# Patient Record
Sex: Female | Born: 1990 | Race: Black or African American | Hispanic: No | Smoking: Never smoker
Health system: Southern US, Community
[De-identification: ages and names within clinical notes are randomized; demographics above are authoritative.]

## PROBLEM LIST (undated history)

## (undated) DIAGNOSIS — Z8619 Personal history of other infectious and parasitic diseases: Secondary | ICD-10-CM

## (undated) DIAGNOSIS — L709 Acne, unspecified: Secondary | ICD-10-CM

## (undated) HISTORY — DX: Acne, unspecified: L70.9

## (undated) HISTORY — DX: Personal history of other infectious and parasitic diseases: Z86.19

---
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
## 2012-01-01 ENCOUNTER — Encounter: Payer: Self-pay | Admitting: Internal Medicine

## 2012-01-07 ENCOUNTER — Ambulatory Visit (INDEPENDENT_AMBULATORY_CARE_PROVIDER_SITE_OTHER): Payer: 59 | Admitting: Family Medicine

## 2012-01-07 ENCOUNTER — Encounter: Payer: Self-pay | Admitting: Family Medicine

## 2012-01-07 VITALS — BP 120/80 | HR 92 | Ht 63.0 in | Wt 143.0 lb

## 2012-01-07 DIAGNOSIS — Z8349 Family history of other endocrine, nutritional and metabolic diseases: Secondary | ICD-10-CM

## 2012-01-07 DIAGNOSIS — Z309 Encounter for contraceptive management, unspecified: Secondary | ICD-10-CM

## 2012-01-07 DIAGNOSIS — Z23 Encounter for immunization: Secondary | ICD-10-CM

## 2012-01-07 DIAGNOSIS — Z0000 Encounter for general adult medical examination without abnormal findings: Secondary | ICD-10-CM

## 2012-01-07 DIAGNOSIS — IMO0001 Reserved for inherently not codable concepts without codable children: Secondary | ICD-10-CM

## 2012-01-07 DIAGNOSIS — L708 Other acne: Secondary | ICD-10-CM

## 2012-01-07 DIAGNOSIS — Z83438 Family history of other disorder of lipoprotein metabolism and other lipidemia: Secondary | ICD-10-CM

## 2012-01-07 DIAGNOSIS — N760 Acute vaginitis: Secondary | ICD-10-CM

## 2012-01-07 DIAGNOSIS — Z13220 Encounter for screening for lipoid disorders: Secondary | ICD-10-CM

## 2012-01-07 DIAGNOSIS — L700 Acne vulgaris: Secondary | ICD-10-CM

## 2012-01-07 LAB — POCT WET PREP (WET MOUNT)

## 2012-01-07 LAB — POCT URINALYSIS DIPSTICK
Nitrite, UA: NEGATIVE
Protein, UA: NEGATIVE
Urobilinogen, UA: NEGATIVE
pH, UA: 8

## 2012-01-07 MED ORDER — NORETHINDRON-ETHINYL ESTRAD-FE 1-20/1-30/1-35 MG-MCG PO TABS: 1.0000 | ORAL_TABLET | Freq: Every day | ORAL | Status: DC

## 2012-01-07 MED ORDER — METRONIDAZOLE 500 MG PO TABS: ORAL_TABLET | ORAL | Status: DC

## 2012-01-07 NOTE — Progress Notes (Signed)
Hannah Brown is a 21 y.o. female who presents for a complete physical.  She has the following concerns:  Acne--much better overall, since being on OCP's.  Still gets occasional cystc lesion, about once a month, usually just prior to getting period.  Needs refill of OCP's.  In a sexual relationship.  Uses condoms, but did have unprotected intercourse intially. +vaginal discharge, slightly yellow, very mild odor.  Slightly itchy, feels different than yeast infections in past. Change in color x 3 months, gradually worse.  Discharge previously was clear.  Health Maintenance: Immunization History  Administered Date(s) Administered  . DTaP 07/31/1993, 11/06/1993, 01/24/1994, 11/06/1994, 02/23/1998  . Hepatitis A [birth year], 1992, 10/10/2010  . Hepatitis B 07/31/1993, 05/02/1994  . IPV 07/31/1993, 11/06/1993, 05/02/1994, 02/23/1998  . MMR 11/06/1994, 02/23/1998  . Meningococcal Conjugate 10/10/2010  . Td 09/20/2010  . Tdap 10/11/2011   Last Pap smear: never Last mammogram: never Last colonoscopy: never Last DEXA: never Dentist: twice yearly Ophtho: every 2 years, wears glasses Exercise: 3x/week (walks, elliptical)  Past Medical History  Diagnosis Date  . Acne   . H/O varicella     History reviewed. No pertinent past surgical history.  History   Social History  . Marital Status: Single    Spouse Name: N/A    Number of Children: N/A  . Years of Education: N/A   Occupational History  . Not on file.   Social History Main Topics  . Smoking status: Never Smoker   . Smokeless tobacco: Never Used  . Alcohol Use: No  . Drug Use: No  . Sexually Active: Yes -- Female partner(s)    Birth Control/ Protection: Pill, Condom   Other Topics Concern  . 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 Hyperlipidemia Father   . Diabetes Maternal Grandmother    . Diabetes Paternal Grandmother   . Cancer Neg Hx    Current Outpatient Prescriptions on File Prior to Visit  Medication Sig Dispense Refill  . diphenhydrAMINE (SOMINEX) 25 MG tablet Take 25 mg by mouth at bedtime as needed.      Marland Kitchen DISCONTD: norethindrone-ethinyl estradiol-iron (ESTROSTEP FE,TILIA FE,TRI-LEGEST FE) 1-20/1-30/1-35 MG-MCG tablet Take 1 tablet by mouth daily.  1 Package  1    Allergies  Allergen Reactions  . Minocycline    ROS:  The patient denies anorexia, fever, headaches,  vision changes, decreased hearing, ear pain, sore throat, breast concerns, chest pain, palpitations, dizziness, syncope, dyspnea on exertion, cough, swelling, nausea, vomiting, diarrhea, constipation, abdominal pain, melena, hematochezia, indigestion/heartburn, hematuria, incontinence, dysuria, irregular menstrual cycles, genital lesions, joint pains, numbness, tingling, weakness, tremor, suspicious skin lesions, depression, anxiety, abnormal bleeding/bruising, or enlarged lymph nodes. +weight gain 2nd semester (when didn't exercise as much b/c didn't continue with [employer]); lost most of it with dietary changes and increased exercise.  PHYSICAL EXAM: BP 120/80  Pulse 92  Ht 5\' 3"  (1.6 m)  Wt 143 lb (64.864 kg)  BMI 25.33 kg/m2  General Appearance:    Alert, cooperative, no distress, appears stated age  Head:    Normocephalic, without obvious abnormality, atraumatic  Eyes:    PERRL, conjunctiva/corneas clear, EOM's intact, fundi    benign  Ears:    Normal TM's and external ear canals  Nose:   Nares normal, mucosa normal, no drainage or sinus  tenderness  Throat:   Lips, mucosa, and tongue normal; teeth and gums normal  Neck:   Supple, no lymphadenopathy;  thyroid:  no   enlargement/tenderness/nodules; no carotid   bruit or JVD  Back:    Spine nontender, no curvature, ROM normal, no CVA     tenderness  Lungs:     Clear to auscultation bilaterally without wheezes, rales or     ronchi;  respirations unlabored  Chest Wall:    No tenderness or deformity   Heart:    Regular rate and rhythm, S1 and S2 normal, no murmur, rub   or gallop  Breast Exam:    No tenderness, masses, or nipple discharge or inversion.      No axillary lymphadenopathy  Abdomen:     Soft, non-tender, nondistended, normoactive bowel sounds,    no masses, no hepatosplenomegaly  Genitalia:    Normal external genitalia without lesions.  BUS and vagina normal; cervix without lesions, but noted to have ectropion anteriorly.  No cervical motion tenderness. Moderate amount of whitish yellowl vaginal discharge.  Uterus and adnexa not enlarged, nontender, no masses.  Pap not performed due to age.  Rectal:    Not performed due to age<40 and no related complaints  Extremities:   No clubbing, cyanosis or edema  Pulses:   2+ and symmetric all extremities  Skin:   Skin color, texture, turgor normal, no rashes or lesions  Lymph nodes:   Cervical, supraclavicular, and axillary nodes normal  Neurologic:   CNII-XII intact, normal strength, sensation and gait; reflexes 2+ and symmetric throughout          Psych:   Normal mood, affect, hygiene and grooming.     ASSESSMENT/PLAN:  1. Routine general medical examination at a health care facility  Visual acuity screening, POCT Urinalysis Dipstick  2. Need for hepatitis A vaccination  Hepatitis A vaccine adult IM  3. Need for HPV vaccination  HPV vaccine quadravalent 3 dose IM  4. Screening for lipoid disorders  Lipid panel  5. Family history of hyperlipidemia  Lipid panel  6. Vaginitis and vulvovaginitis, unspecified  POCT Wet Prep ([place]), GC/Chlamydia Amp Probe, Genital, metroNIDAZOLE (FLAGYL) 500 MG tablet   bacterial vaginosis  7. Contraception  norethindrone-ethinyl estradiol-iron (ESTROSTEP FE,TILIA FE,TRI-LEGEST FE) 1-20/1-30/1-35 MG-MCG tablet  8. Acne vulgaris  norethindrone-ethinyl estradiol-iron (ESTROSTEP FE,TILIA FE,TRI-LEGEST FE) 1-20/1-30/1-35 MG-MCG tablet    BV--risks and side effects of flagyl reviewed.    Discussed monthly self breast exams and yearly mammograms after the age of 59; at least 30 minutes of aerobic activity at least 5 days/week; proper sunscreen use reviewed; healthy diet, including goals of calcium and vitamin D intake and alcohol recommendations (less than or equal to 1 drink/day) reviewed; regular seatbelt use; changing batteries in smoke detectors.  Immunization recommendations discussed--Hep A#2 and Gardisil #1 given today. Encouraged regular condom use for prevention of STDs, reminded of need for backup contraception with antibiotic use on OCP's  NV for Gardisil #2 in 2 months, #3 in 6 months vs getting at school. Fasting lipids when she comes for Gardisil in 2 months.

## 2012-01-07 NOTE — Patient Instructions (Addendum)
HEALTH MAINTENANCE RECOMMENDATIONS:  It is recommended that you get at least 30 minutes of aerobic exercise at least 5 days/week (for weight loss, you may need as much as 60-90 minutes). This can be any activity that gets your heart rate up. This can be divided in 10-15 minute intervals if needed, but try and build up your endurance at least once a week.  Weight bearing exercise is also recommended twice weekly.  Eat a healthy diet with lots of vegetables, fruits and fiber.  "Colorful" foods have a lot of vitamins (ie green vegetables, tomatoes, red peppers, etc).  Limit sweet tea, regular sodas and alcoholic beverages, all of which has a lot of calories and sugar.  Up to 1 alcoholic drink daily may be beneficial for women (unless trying to lose weight, watch sugars).  Drink a lot of water.  Calcium recommendations are 1200-1500 mg daily (1500 mg for postmenopausal women or women without ovaries), and vitamin D 1000 IU daily.  This should be obtained from diet and/or supplements (vitamins), and calcium should not be taken all at once, but in divided doses.  Monthly self breast exams and yearly mammograms for women over the age of 58 is recommended.  Sunscreen of at least SPF 30 should be used on all sun-exposed parts of the skin when outside between the hours of 10 am and 4 pm (not just when at beach or pool, but even with exercise, golf, tennis, and yard work!)  Use a sunscreen that says "broad spectrum" so it covers both UVA and UVB rays, and make sure to reapply every 1-2 hours.  Remember to change the batteries in your smoke detectors when changing your clock times in the spring and fall.  Use your seat belt every time you are in a car, and please drive safely and not be distracted with cell phones and texting while driving.  Remember to use condoms to prevent STD's.  Remember to also use condoms if you are ever put on antibiotics, as these lessen the efficacy of birth control pills.  Bacterial  Vaginosis Bacterial vaginosis (BV) is a vaginal infection where the normal balance of bacteria in the vagina is disrupted. The normal balance is then replaced by an overgrowth of certain bacteria. There are several different kinds of bacteria that can cause BV. BV is the most common vaginal infection in women of childbearing age. CAUSES   The cause of BV is not fully understood. BV develops when there is an increase or imbalance of harmful bacteria.   Some activities or behaviors can upset the normal balance of bacteria in the vagina and put women at increased risk including:   Having a new sex partner or multiple sex partners.   Douching.   Using an intrauterine device (IUD) for contraception.   It is not clear what role sexual activity plays in the development of BV. However, women that have never had sexual intercourse are rarely infected with BV.  Women do not get BV from toilet seats, bedding, swimming pools or from touching objects around them.  SYMPTOMS   Grey vaginal discharge.   A fish-like odor with discharge, especially after sexual intercourse.   Itching or burning of the vagina and vulva.   Burning or pain with urination.   Some women have no signs or symptoms at all.  DIAGNOSIS  Your caregiver must examine the vagina for signs of BV. Your caregiver will perform lab tests and look at the sample of vaginal fluid through a  microscope. They will look for bacteria and abnormal cells (clue cells), a pH test higher than 4.5, and a positive amine test all associated with BV.  RISKS AND COMPLICATIONS   Pelvic inflammatory disease (PID).   Infections following gynecology surgery.   Developing HIV.   Developing herpes virus.  TREATMENT  Sometimes BV will clear up without treatment. However, all women with symptoms of BV should be treated to avoid complications, especially if gynecology surgery is planned. Female partners generally do not need to be treated. However, BV may  spread between female sex partners so treatment is helpful in preventing a recurrence of BV.   BV may be treated with antibiotics. The antibiotics come in either pill or vaginal cream forms. Either can be used with nonpregnant or pregnant women, but the recommended dosages differ. These antibiotics are not harmful to the baby.   BV can recur after treatment. If this happens, a second round of antibiotics will often be prescribed.   Treatment is important for pregnant women. If not treated, BV can cause a premature delivery, especially for a pregnant woman who had a premature birth in the past. All pregnant women who have symptoms of BV should be checked and treated.   For chronic reoccurrence of BV, treatment with a type of prescribed gel vaginally twice a week is helpful.  HOME CARE INSTRUCTIONS   Finish all medication as directed by your caregiver.   Do not have sex until treatment is completed.   Tell your sexual partner that you have a vaginal infection. They should see their caregiver and be treated if they have problems, such as a mild rash or itching.   Practice safe sex. Use condoms. Only have 1 sex partner.  PREVENTION  Basic prevention steps can help reduce the risk of upsetting the natural balance of bacteria in the vagina and developing BV:  Do not have sexual intercourse (be abstinent).   Do not douche.   Use all of the medicine prescribed for treatment of BV, even if the signs and symptoms go away.   Tell your sex partner if you have BV. That way, they can be treated, if needed, to prevent reoccurrence.  SEEK MEDICAL CARE IF:   Your symptoms are not improving after 3 days of treatment.   You have increased discharge, pain, or fever.  MAKE SURE YOU:   Understand these instructions.   Will watch your condition.   Will get help right away if you are not doing well or get worse.  FOR MORE INFORMATION  Division of STD Prevention (DSTDP), Centers for Disease Control  and Prevention: SolutionApps.[employer].za American Social Health Association (ASHA): www.ashastd.org  Document Released: 08/04/2005 Document Revised: 07/24/2011 Document Reviewed: 01/25/2009 [facility] Patient Information 2012 [place], [place].

## 2012-02-23 ENCOUNTER — Telehealth: Payer: Self-pay | Admitting: Family Medicine

## 2012-02-23 NOTE — Telephone Encounter (Signed)
Looks like at time of CPE, we did wet prep which showed BV.  Rx's metronidazole.  If ongoing symptoms of vaginal discharge, needs OV for eval

## 2012-02-23 NOTE — Telephone Encounter (Signed)
Spoke with patient and she scheduled appt with Dr.Knapp for 03/01/12.

## 2012-03-01 ENCOUNTER — Ambulatory Visit (INDEPENDENT_AMBULATORY_CARE_PROVIDER_SITE_OTHER): Payer: 59 | Admitting: Family Medicine

## 2012-03-01 ENCOUNTER — Encounter: Payer: Self-pay | Admitting: Family Medicine

## 2012-03-01 VITALS — BP 128/78 | HR 80 | Ht 63.0 in | Wt 139.0 lb

## 2012-03-01 DIAGNOSIS — Z83438 Family history of other disorder of lipoprotein metabolism and other lipidemia: Secondary | ICD-10-CM

## 2012-03-01 DIAGNOSIS — Z13220 Encounter for screening for lipoid disorders: Secondary | ICD-10-CM

## 2012-03-01 DIAGNOSIS — N760 Acute vaginitis: Secondary | ICD-10-CM

## 2012-03-01 DIAGNOSIS — Z8349 Family history of other endocrine, nutritional and metabolic diseases: Secondary | ICD-10-CM

## 2012-03-01 DIAGNOSIS — Z23 Encounter for immunization: Secondary | ICD-10-CM

## 2012-03-01 LAB — POCT WET PREP (WET MOUNT)

## 2012-03-01 LAB — LIPID PANEL
Total CHOL/HDL Ratio: 3.7 Ratio
VLDL: 22 mg/dL (ref 0–40)

## 2012-03-01 MED ORDER — METRONIDAZOLE 0.75 % VA GEL: 1.0000 | Freq: Every day | VAGINAL | Status: AC

## 2012-03-01 MED ORDER — FLUCONAZOLE 150 MG PO TABS: 150.0000 mg | ORAL_TABLET | Freq: Once | ORAL | Status: AC

## 2012-03-01 NOTE — Progress Notes (Signed)
Chief Complaint  Patient presents with  . Vaginal Discharge    took meds for BV late May-went away for one day and then came back. Symptoms include discharge that is yellow and burns. No odor noted.   HPI: She was diagnosed with BV at her CPE in May.  She took the week of metronidazole, and the discharge improved during the course of the medicine, and for one day after, but then discharge recurred.  Initially had burning discomfort externally, especially after voiding, but that only lasted a day or so. Currently, her vaginal discharge is light yellow, no odor.  Sometimes has some mild itching.  Symptoms essentially unchanged since symptoms recurrred 1 day after completing metronidazole.  She hasn't had intercourse since her last visit (CPE, where BV was diagnosed), although is still in a monogamous relationship with same partner.  Past Medical History  Diagnosis Date  . Acne   . H/O varicella    History   Social History  . Marital Status: Single    Spouse Name: N/A    Number of Children: N/A  . Years of Education: N/A   Occupational History  . Not on file.   Social History Main Topics  . Smoking status: Never Smoker   . Smokeless tobacco: Never Used  . Alcohol Use: No  . Drug Use: No  . Sexually Active: Yes -- Female partner(s)    Birth Control/ Protection: Pill, Condom   Other Topics Concern  . 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 norethindrone-ethinyl estradiol-iron (ESTROSTEP FE,TILIA FE,TRI-LEGEST FE) 1-20/1-30/1-35 MG-MCG tablet Take 1 tablet by mouth daily.  1 Package  11  . metroNIDAZOLE (FLAGYL) 500 MG tablet 1 tablet by mouth twice daily for 7 days  14 tablet  0   ROS:  Denies pelvic pain, fever, dysuria, back pain or other concerns.  Denies fevers.  PHYSICAL EXAM: BP 128/78   Pulse 80  Ht 5\' 3"  (1.6 m)  Wt 139 lb (63.05 kg)  BMI 24.62 kg/m2  LMP 02/03/2012 Pleasant female in no distress External genitalia normal without lesions. Thick white, slightly yellow-tinged discharge.  Cervix appears normal.  Normal external genitalia without lesions  KOH--some yeast seen Wet prep--+bacteria, a few clue cells  ASSESSMENT/PLAN: 1. Screening for lipoid disorders  Lipid panel  2. Family history of hyperlipidemia  Lipid panel  3. Vaginitis and vulvovaginitis, unspecified  POCT Wet Prep [facility]), fluconazole (DIFLUCAN) 150 MG tablet, metroNIDAZOLE (METROGEL VAGINAL) 0.75 % vaginal gel  4. Need for HPV vaccination  HPV vaccine quadravalent 3 dose IM   Vaginitis--treat with fluconazole and metrogel to treat for both BV and yeast, as seen on wet prep/KOH  Check lipids, as previously ordered for patient--nonfasting (4 hours since she last ate)--if abnormal, then will have her return fasting for repeat.  HPV #2 given; f/u in 6 months for 3rd/final vaccine.

## 2012-03-01 NOTE — Patient Instructions (Signed)
Take the fluconazole today to treat yeast infection.  Take the second pill next week only if you still having any discharge or itching.  (or save the second pill for yeast infection in future).  Use the metrogel intravaginally--1 applicatorful at bedtime for 5 days.  Return in 6 months for last Gardisil vaccination.

## 2012-03-02 ENCOUNTER — Encounter: Payer: Self-pay | Admitting: Family Medicine

## 2012-03-08 ENCOUNTER — Other Ambulatory Visit: Payer: 59

## 2012-03-30 ENCOUNTER — Telehealth: Payer: Self-pay | Admitting: Family Medicine

## 2012-03-30 MED ORDER — FLUCONAZOLE 150 MG PO TABS: ORAL_TABLET | ORAL | Status: DC

## 2012-03-30 NOTE — Telephone Encounter (Signed)
Finished Rx for yeast infection about 1 month ago, most of symptoms went away nut now it is back   Wants refill on  Please call patient   [employer]

## 2012-03-30 NOTE — Telephone Encounter (Signed)
Diflucan was called in

## 2012-04-05 ENCOUNTER — Telehealth: Payer: Self-pay | Admitting: Internal Medicine

## 2012-04-05 NOTE — Telephone Encounter (Signed)
Advise pt--full year of rx sent to local [facility].  She just needs to transfer rx to her [facility]

## 2012-12-10 ENCOUNTER — Other Ambulatory Visit: Payer: Self-pay | Admitting: Family Medicine

## 2012-12-10 NOTE — Telephone Encounter (Signed)
Her last physical was 12/2011 and she doesn't have one scheduled.  Refill one pack and schedule

## 2012-12-10 NOTE — Telephone Encounter (Signed)
Is this ok?

## 2013-01-12 ENCOUNTER — Telehealth: Payer: Self-pay | Admitting: Family Medicine

## 2013-01-12 MED ORDER — NORETHINDRON-ETHINYL ESTRAD-FE 1-20/1-30/1-35 MG-MCG PO TABS: 1.0000 | ORAL_TABLET | Freq: Every day | ORAL | Status: DC

## 2013-01-12 NOTE — Telephone Encounter (Signed)
Pt called for refill of birth control to [facility] on [address].  She has cpe with Dr. Lynelle Doctor on 6/2 at 9:15.

## 2013-01-12 NOTE — Telephone Encounter (Signed)
done

## 2013-01-12 NOTE — Telephone Encounter (Signed)
Will she need the refill prior to her visit? If so, call in just 1 pack

## 2013-01-17 ENCOUNTER — Encounter: Payer: Self-pay | Admitting: Family Medicine

## 2013-01-17 ENCOUNTER — Ambulatory Visit (INDEPENDENT_AMBULATORY_CARE_PROVIDER_SITE_OTHER): Payer: 59 | Admitting: Family Medicine

## 2013-01-17 VITALS — BP 140/100 | HR 100 | Ht 63.0 in | Wt 150.0 lb

## 2013-01-17 DIAGNOSIS — Z309 Encounter for contraceptive management, unspecified: Secondary | ICD-10-CM

## 2013-01-17 DIAGNOSIS — Z0000 Encounter for general adult medical examination without abnormal findings: Secondary | ICD-10-CM

## 2013-01-17 DIAGNOSIS — Z23 Encounter for immunization: Secondary | ICD-10-CM

## 2013-01-17 DIAGNOSIS — L700 Acne vulgaris: Secondary | ICD-10-CM

## 2013-01-17 DIAGNOSIS — L708 Other acne: Secondary | ICD-10-CM

## 2013-01-17 LAB — POCT URINALYSIS DIPSTICK
Glucose, UA: NEGATIVE
Leukocytes, UA: NEGATIVE
Nitrite, UA: NEGATIVE
Urobilinogen, UA: NEGATIVE

## 2013-01-17 MED ORDER — CLINDAMYCIN PHOSPHATE 1 % EX FOAM: CUTANEOUS | Status: DC

## 2013-01-17 MED ORDER — NORETHINDRON-ETHINYL ESTRAD-FE 1-20/1-30/1-35 MG-MCG PO TABS: 1.0000 | ORAL_TABLET | Freq: Every day | ORAL | Status: DC

## 2013-01-17 NOTE — Progress Notes (Signed)
Chief Complaint  Patient presents with  . Annual Exam    nonfasting annual exam, unsure whether or not she needs pap today. She would like to discuss her acne and desires a facial wash and/or cream if possible. States her acne is very cystic. UA showed trace leuks, patient is asymptomatic.   Hannah Brown is a 22 y.o. female who presents for a complete physical.  She has the following concerns: Acne, as per chief complaint.  Acne--improved with OCP's, but "not good enough".  Initially breakouts were just related with her cycles, but now is more often.  Some are cystic, but also noticing smaller pimples/pustules.  Using a benzoyl peroxide face wash twice daily, and a Clearisil acne gel for spot treatment.  Used her aunt's clindamycin 1% gel which helped clear up one of her cysts.  In a sexual relationship, using condoms.  Same partner as last year.  Immunization History  Administered Date(s) Administered  . DTaP 07/31/1993, 11/06/1993, 01/24/1994, 11/06/1994, 02/23/1998  . HPV Quadrivalent 01/07/2012, 03/01/2012, 01/17/2013  . Hepatitis A 10/10/2010, 01/07/2012  . Hepatitis B 1991-08-09, 07/31/1993, 05/02/1994  . IPV 07/31/1993, 11/06/1993, 05/02/1994, 02/23/1998  . MMR 11/06/1994, 02/23/1998  . Meningococcal Conjugate 10/10/2010  . Td 09/20/2010  . Tdap 10/11/2011   Last Pap smear: never; had pelvic exam in December (evaluation for vaginal odor, no infection found).  Doesn't think chlamydia screen was done. Last mammogram: never  Last colonoscopy: never  Last DEXA: never  Dentist: twice yearly  Ophtho: every 2 years, wears glasses; past due Exercise: daily--elliptical or walking (45 min on elliptical, 2 hrs of walking)   Lab Results  Component Value Date   CHOL 195* 03/01/2012   HDL 53 03/01/2012   LDLCALC 120* 03/01/2012   TRIG 111 03/01/2012   CHOLHDL 3.7 03/01/2012    Past Medical History  Diagnosis Date  . Acne   . H/O varicella     History reviewed. No pertinent past  surgical history.  History   Social History  . Marital Status: Single    Spouse Name: N/A    Number of Children: N/A  . Years of Education: N/A   Occupational History  . student    Social History Main Topics  . Smoking status: Never Smoker   . Smokeless tobacco: Never Used  . Alcohol Use: No  . Drug Use: No  . Sexually Active: Yes -- Female partner(s)    Birth Control/ Protection: Pill, Condom   Other Topics Concern  . 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 Hyperlipidemia Father   . Diabetes Maternal Grandmother   . Diabetes Paternal Grandmother   . Cancer Paternal Aunt     thyroid cancer    Current outpatient prescriptions:Biotin 5000 MCG CAPS, Take 1 capsule by mouth daily., Disp: , Rfl: ;  norethindrone-ethinyl estradiol-iron (TRI-LEGEST FE) 1-20/1-30/1-35 MG-MCG tablet, Take 1 tablet by mouth daily., Disp: 84 tablet, Rfl: 3;  Probiotic Product (PROBIOTIC DAILY) CAPS, Take 1 capsule by mouth daily., Disp: , Rfl: ;  vitamin C (ASCORBIC ACID) 500 MG tablet, Take 500 mg by mouth daily., Disp: , Rfl:  Clindamycin Phosphate foam, Apply to face once daily, Disp: 100 g, Rfl: 5  Allergies  Allergen Reactions  . Minocycline Hives and Other (See Comments)    Itchy throat   ROS:  The patient denies anorexia, fever, headaches, vision changes, decreased hearing, ear pain, sore throat, breast concerns, chest pain, palpitations, dizziness, syncope, dyspnea on exertion, cough, swelling, nausea, vomiting, diarrhea, constipation, abdominal pain, melena, hematochezia, indigestion/heartburn, hematuria, incontinence, dysuria, irregular menstrual cycles, genital lesions, joint pains, numbness, tingling, weakness, tremor, suspicious skin lesions, depression, anxiety, abnormal bleeding/bruising, or enlarged lymph nodes. +acne She noticed an "oniony" smell  to her vaginal area. She went to student health, and was told there was no infection.  She used probiotics and that makes the odor go away, but recurs when she stops the probiotics.  Denies any vaginal discharge. Weight gain from last year noted--she denies any change in how clothing fits; exercising more than last year, increased muscle mass. +allergies flaring, controlled with OTC loratadine prn.  Sporadic insomnia.  PHYSICAL EXAM: BP 140/100  Pulse 100  Ht 5\' 3"  (1.6 m)  Wt 150 lb (68.04 kg)  BMI 26.58 kg/m2  General Appearance:  Alert, cooperative, no distress, appears stated age   Head:  Normocephalic, without obvious abnormality, atraumatic   Eyes:  PERRL, conjunctiva/corneas clear, EOM's intact, fundi  benign   Ears:  Normal TM's and external ear canals   Nose:  Nares normal, mucosa normal, no drainage or sinus tenderness   Throat:  Lips, mucosa, and tongue normal; teeth and gums normal   Neck:  Supple, no lymphadenopathy; thyroid: no enlargement/tenderness/nodules; no carotid  bruit or JVD   Back:  Spine nontender, no curvature, ROM normal, no CVA tenderness   Lungs:  Clear to auscultation bilaterally without wheezes, rales or ronchi; respirations unlabored   Chest Wall:  No tenderness or deformity   Heart:  Regular rate and rhythm, S1 and S2 normal, no murmur, rub  or gallop   Breast Exam:  No tenderness, masses, or nipple discharge or inversion. R nipple is pierced. No axillary lymphadenopathy   Abdomen:  Soft, non-tender, nondistended, normoactive bowel sounds,  no masses, no hepatosplenomegaly   Genitalia:  Deferred.  No complaints (of discharge, pain, abnormal bleeding), and had normal pelvic exam at school  Rectal:  Not performed due to age<40 and no related complaints   Extremities:  No clubbing, cyanosis or edema   Pulses:  2+ and symmetric all extremities   Skin:  Skin color, texture, turgor normal, no rashes or lesions. Scattered pustules on forehead, chin.  Small  cystic lesion at top of forehead  Lymph nodes:  Cervical, supraclavicular, and axillary nodes normal   Neurologic:  CNII-XII intact, normal strength, sensation and gait; reflexes 2+ and symmetric throughout   Psych: Normal mood, affect, hygiene and grooming.   ASSESSMENT/PLAN:  Routine general medical examination at a health care facility - Plan: Visual acuity screening, POCT Urinalysis Dipstick, GC/Chlamydia Probe Amp  Acne vulgaris - Plan: Clindamycin Phosphate foam  Unspecified contraceptive management - Plan: norethindrone-ethinyl estradiol-iron (TRI-LEGEST FE) 1-20/1-30/1-35 MG-MCG tablet  Need for HPV vaccination - Plan: HPV vaccine quadravalent 3 dose IM   Urine screen for chlamydia (pelvic done in December at school); declines HIV screen.  Discussed monthly self breast exams and yearly mammograms after the age of 9; at least 30 minutes of aerobic activity at least 5 days/week; proper sunscreen use reviewed; healthy diet, including goals of calcium and vitamin D intake and alcohol recommendations (less than or equal to 1 drink/day) reviewed; regular seatbelt use; changing batteries in smoke detectors. Immunization recommendations discussed. Encouraged regular condom use for prevention of STDs, reminded of need for backup contraception with antibiotic use on OCP's  3rd HPV today.  Discussed elevated BP today, normal in past, also on OCP's.  Low sodium diet, check BP elsewhere.  Return if remains >140/90. Repeat BP was a little lower by nurse (just prior to giving HPV vaccine)

## 2013-01-17 NOTE — Patient Instructions (Signed)

## 2013-01-18 LAB — GC/CHLAMYDIA PROBE AMP
CT Probe RNA: NEGATIVE
GC Probe RNA: NEGATIVE

## 2013-02-21 ENCOUNTER — Telehealth: Payer: Self-pay | Admitting: Family Medicine

## 2013-02-21 NOTE — Telephone Encounter (Signed)
Needs OV.  Pelvic exam not done at recent CPE (because no complaints at that time).  We did not prescribe any ABX for her that would cause infection

## 2013-02-21 NOTE — Telephone Encounter (Signed)
Pt wants Rx for diflucan for yeast infection   Please call    [facility]

## 2013-02-21 NOTE — Telephone Encounter (Signed)
Left message for pt to call back and schedule OV with Dr.Knapp or any of the other providers.

## 2013-03-21 ENCOUNTER — Encounter: Payer: Self-pay | Admitting: Family Medicine

## 2013-03-21 ENCOUNTER — Ambulatory Visit (INDEPENDENT_AMBULATORY_CARE_PROVIDER_SITE_OTHER): Payer: 59 | Admitting: Family Medicine

## 2013-03-21 VITALS — BP 120/86 | HR 76 | Temp 98.3°F | Ht 63.0 in | Wt 150.0 lb

## 2013-03-21 DIAGNOSIS — N760 Acute vaginitis: Secondary | ICD-10-CM

## 2013-03-21 DIAGNOSIS — R300 Dysuria: Secondary | ICD-10-CM

## 2013-03-21 DIAGNOSIS — N730 Acute parametritis and pelvic cellulitis: Secondary | ICD-10-CM

## 2013-03-21 LAB — POCT URINALYSIS DIPSTICK
Nitrite, UA: NEGATIVE
Urobilinogen, UA: NEGATIVE
pH, UA: 5

## 2013-03-21 LAB — POCT WET PREP (WET MOUNT)

## 2013-03-21 MED ORDER — FLUCONAZOLE 150 MG PO TABS: ORAL_TABLET | ORAL | Status: DC

## 2013-03-21 MED ORDER — AZITHROMYCIN 500 MG PO TABS: 1000.0000 mg | ORAL_TABLET | Freq: Once | ORAL | Status: DC

## 2013-03-21 MED ORDER — CEFTRIAXONE SODIUM 250 MG IJ SOLR: 250.0000 mg | Freq: Once | INTRAMUSCULAR | Status: DC

## 2013-03-21 MED ORDER — CEFTRIAXONE SODIUM 250 MG IJ SOLR
250.0000 mg | Freq: Once | INTRAMUSCULAR | Status: AC
  Administered 2013-03-21: 250 mg via INTRAMUSCULAR

## 2013-03-21 NOTE — Progress Notes (Signed)
Chief Complaint  Patient presents with  . Urinary Frequency    and burning x 1 week.    Noticed a bloody, yellow vaginal discharge about a week ago, no longer is bloody, but remains yellow.  +odor, not fishy, but hard to describe, not normal. It is different than previous BV infection.  4 days ago started with urinary frequency and dysuria.She is having urgency as well.  Also having some vaginal itching.  Pain is external, not internal (with voiding).  Denies fevers, chills.  Had some lower abdominal cramping yesterday, no discomfort today.  LMP 2 weeks ago, during placebo pills, normal.  Hasn't had intercourse in about 2 months, uses condoms.  Past Medical History  Diagnosis Date  . Acne   . H/O varicella    History reviewed. No pertinent past surgical history.  History   Social History  . Marital Status: Single    Spouse Name: N/A    Number of Children: N/A  . Years of Education: N/A   Occupational History  . student    Social History Main Topics  . Smoking status: Never Smoker   . Smokeless tobacco: Never Used  . Alcohol Use: No  . Drug Use: No  . Sexually Active: Yes -- Female partner(s)    Birth Control/ Protection: Pill, Condom   Other Topics Concern  . 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 Minocycline Hives and Other (See Comments)    Itchy throat   ROS:  Denies nausea, vomiting, diarrhea, back pain, skin rashes, bleeding,  bruising, URI symptoms, cough, shortness of breath.  Had a slight cold 2 weeks ago, resolved.  PHYSICAL EXAM: BP 120/86  Pulse 76  Temp(Src) 98.3 F (36.8 C) (Oral)  Ht 5\' 3"  (1.6 m)  Wt 150 lb (68.04 kg)  BMI 26.58 kg/m2  LMP 03/08/2013 Well developed, pleasant female in no distress Heart: regular rate and rhythm without murmur Lungs: clear bilaterally Abdomen: mild suprapubic tenderness, no mass GU:  Normal external genitalia without lesions.  Moderate amount of yellow discharge present, no odor noted.  Cervix without lesions.  nontender anteriorly over bladder.  Mild cervical motion tenderness.  Adnexa normal, no mass, nontender Skin: no rash Psych: normal mood, affect, hygiene and grooming  Wet prep:  5-15 PMNs/hpf, mod bacteria.  No clue cells or trich.  No yeast on KOH  Urine dip:  Trace leuks, trace blood  ASSESSMENT/PLAN:  Burning with urination - Plan: POCT Urinalysis Dipstick, Urine culture  PID (acute pelvic inflammatory disease) - Plan: azithromycin (ZITHROMAX) 500 MG tablet, cefTRIAXone (ROCEPHIN) injection 250 mg, DISCONTINUED: cefTRIAXone (ROCEPHIN) 250 MG injection  Vaginitis and vulvovaginitis, unspecified - Plan: POCT Wet Prep ([place]), fluconazole (DIFLUCAN) 150 MG tablet, GC/Chlamydia Probe Amp  Pelvic pain with dysuria and vaginal discharge. Send urine for culture. GC and chlamydia sent  Treat for PID, f/u in 1 week if not better. Might also need ABX for UTI if +culture (  not treating presumptively given minimally abnormal dip, and reason for contaminant and pelvic pain found)  Counseled re: risks and side effects of meds Given diflucan to use prn developing yeast infection due to ABX (not to use now, no yeast infection at present)

## 2013-03-21 NOTE — Patient Instructions (Addendum)
We are sending your urine for culture.  You haven't been treated for bladder infection, as symptoms might be more related to the pelvic infection. There was no evidence of yeast, bacterial vaginosis or trichomonas on your exam today, but there were a lot of white cells and bacterial, suggesting infection.  Given the tenderness you had on exam, we are treating presumptively for pelvic inflammatory disease.  Tests for gonorrhea and chlamydia should be back in 1-2 days.  Follow up in 1 week if you have ongoing symptoms.  We will be in touch with your results (urine culture can take 3 days) Use the diflucan only if you develop worsening itching and discharge for yeast infection  Pelvic Inflammatory Disease Pelvic inflammatory disease (PID) refers to an infection in some or all of the female organs. The infection can be in the uterus, ovaries, fallopian tubes, or the surrounding tissues in the pelvis. PID can cause abdominal or pelvic pain that comes on suddenly (acute pelvic pain). PID is a serious infection because it can lead to lasting (chronic) pelvic pain or the inability to have children (infertile).  CAUSES  The infection is often caused by the normal bacteria found in the vaginal tissues. PID may also be caused by an infection that is spread during sexual contact. PID can also occur following:   The birth of a baby.   A miscarriage.   An abortion.   Major pelvic surgery.   The use of an intrauterine device (IUD).   A sexual assault.  RISK FACTORS Certain factors can put a person at higher risk for PID, such as:  Being younger than 25 years.  Being sexually active at [place] age.  Usingnonbarrier contraception.  Havingmultiple sexual partners.  Having sex with someone who has symptoms of a genital infection.  Using oral contraception. Other times, certain behaviors can increase the possibility of getting PID, such as:  Having sex during your period.  Using a vaginal  douche.  Having an intrauterine device (IUD) in place. SYMPTOMS   Abdominal or pelvic pain.   Fever.   Chills.   Abnormal vaginal discharge.  Abnormal uterine bleeding.   Unusual pain shortly after finishing your period. DIAGNOSIS  Your caregiver will choose some of the following methods to make a diagnosis, such as:   Performinga physical exam and history. A pelvic exam typically reveals a very tender uterus and surrounding pelvis.   Ordering laboratory tests including a pregnancy test, blood tests, and urine test.  Orderingcultures of the vagina and cervix to check for a sexually transmitted infection (STI).  Performing an ultrasound.   Performing a laparoscopic procedure to look inside the pelvis.  TREATMENT   Antibiotic medicines may be prescribed and taken by mouth.   Sexual partners may be treated when the infection is caused by a sexually transmitted disease (STD).   Hospitalization may be needed to give antibiotics intravenously.  Surgery may be needed, but this is rare. It may take weeks until you are completely well. If you are diagnosed with PID, you should also be checked for human immunodeficiency virus (HIV). HOME CARE INSTRUCTIONS   If given, take your antibiotics as directed. Finish the medicine even if you start to feel better.   Only take over-the-counter or prescription medicines for pain, discomfort, or fever as directed by your caregiver.   Do not have sexual intercourse until treatment is completed or as directed by your caregiver. If PID is confirmed, your recent sexual partner(s) will need treatment.  Keep your follow-up appointments. SEEK MEDICAL CARE IF:   You have increased or abnormal vaginal discharge.   You need prescription medicine for your pain.   You vomit.   You cannot take your medicines.   Your partner has an STD.  SEEK IMMEDIATE MEDICAL CARE IF:   You have a fever.   You have increased  abdominal or pelvic pain.   You have chills.   You have pain when you urinate.   You are not better after 72 hours following treatment.  MAKE SURE YOU:   Understand these instructions.  Will watch your condition.  Will get help right away if you are not doing well or get worse. Document Released: 08/04/2005 Document Revised: 04/28/2012 Document Reviewed: 07/31/2011 [facility] Patient Information 2014 [place], [place].

## 2013-03-24 LAB — URINE CULTURE: Colony Count: >=100000

## 2013-03-25 ENCOUNTER — Telehealth: Payer: Self-pay | Admitting: Internal Medicine

## 2013-03-25 MED ORDER — LEVOFLOXACIN 500 MG PO TABS: 500.0000 mg | ORAL_TABLET | Freq: Every day | ORAL | Status: DC

## 2013-03-25 MED ORDER — METRONIDAZOLE 500 MG PO TABS: 500.0000 mg | ORAL_TABLET | Freq: Two times a day (BID) | ORAL | Status: DC

## 2013-03-25 NOTE — Telephone Encounter (Signed)
Message copied by Joslyn Hy on Fri Mar 25, 2013  1:35 PM ------      Message from: KNAPP, EVE      Created: Fri Mar 25, 2013 10:19 AM       Advise pt--will treat with add'l ABX that will cover UTI, as well as anaerobic bacteria that may have not been covered with original ABX.  Please send in levaquin 500mg  1 tab po qd x 10 days, and flagyl 500mg  po bid x 10 days.  NO ALCOHOL while on flagyl, and for 24 hrs after.  She should schedule f/u for the week I return, if not better, or see someone else next week if symptoms persist/worsen despite this treatment (the flagyl will also cover BV, which I did not see at the time of her visit) ------

## 2013-03-25 NOTE — Telephone Encounter (Signed)
Called out meds

## 2013-03-29 ENCOUNTER — Telehealth: Payer: Self-pay | Admitting: Internal Medicine

## 2013-03-29 MED ORDER — METRONIDAZOLE 500 MG PO TABS: 500.0000 mg | ORAL_TABLET | Freq: Two times a day (BID) | ORAL | Status: DC

## 2013-03-29 NOTE — Telephone Encounter (Signed)
Let her know that missing 2 pills is not a major concern. If she would like we can call it in but it is probably not necessary

## 2013-03-29 NOTE — Telephone Encounter (Signed)
Pt states she dropped 2 of her flagyl pills down the sink and she is suppose to be done by Friday with her pills. Send to [facility]

## 2013-03-29 NOTE — Telephone Encounter (Signed)
SENT IN 2 PILLS

## 2013-06-29 ENCOUNTER — Encounter: Payer: Self-pay | Admitting: *Deleted

## 2013-07-18 ENCOUNTER — Encounter: Payer: Self-pay | Admitting: Internal Medicine

## 2014-03-20 ENCOUNTER — Telehealth: Payer: Self-pay | Admitting: Family Medicine

## 2014-03-20 DIAGNOSIS — Z309 Encounter for contraceptive management, unspecified: Secondary | ICD-10-CM

## 2014-03-20 MED ORDER — NORETHINDRON-ETHINYL ESTRAD-FE 1-20/1-30/1-35 MG-MCG PO TABS: 1.0000 | ORAL_TABLET | Freq: Every day | ORAL | Status: DC

## 2014-03-20 NOTE — Telephone Encounter (Signed)
done

## 2014-07-12 ENCOUNTER — Other Ambulatory Visit (HOSPITAL_COMMUNITY)
Admission: RE | Admit: 2014-07-12 | Discharge: 2014-07-12 | Disposition: A | Discharge status: Home / Self Care | Payer: 59 | Source: Ambulatory Visit | Attending: Family Medicine | Admitting: Family Medicine

## 2014-07-12 ENCOUNTER — Ambulatory Visit (INDEPENDENT_AMBULATORY_CARE_PROVIDER_SITE_OTHER): Payer: 59 | Admitting: Family Medicine

## 2014-07-12 ENCOUNTER — Encounter: Payer: Self-pay | Admitting: Family Medicine

## 2014-07-12 VITALS — BP 112/72 | HR 88 | Ht 63.0 in | Wt 153.0 lb

## 2014-07-12 DIAGNOSIS — Z113 Encounter for screening for infections with a predominantly sexual mode of transmission: Secondary | ICD-10-CM | POA: Diagnosis present

## 2014-07-12 DIAGNOSIS — Z01419 Encounter for gynecological examination (general) (routine) without abnormal findings: Secondary | ICD-10-CM | POA: Insufficient documentation

## 2014-07-12 DIAGNOSIS — Z0000 Encounter for general adult medical examination without abnormal findings: Secondary | ICD-10-CM

## 2014-07-12 DIAGNOSIS — Z3041 Encounter for surveillance of contraceptive pills: Secondary | ICD-10-CM

## 2014-07-12 DIAGNOSIS — F411 Generalized anxiety disorder: Secondary | ICD-10-CM

## 2014-07-12 DIAGNOSIS — L700 Acne vulgaris: Secondary | ICD-10-CM

## 2014-07-12 LAB — POCT URINALYSIS DIPSTICK
Bilirubin, UA: NEGATIVE
Blood, UA: NEGATIVE
Glucose, UA: NEGATIVE
Ketones, UA: NEGATIVE
Leukocytes, UA: NEGATIVE
NITRITE UA: NEGATIVE
PROTEIN UA: NEGATIVE
Spec Grav, UA: 1.015
UROBILINOGEN UA: NEGATIVE
pH, UA: 7

## 2014-07-12 MED ORDER — CLINDAMYCIN PHOSPHATE 1 % EX FOAM: CUTANEOUS | Status: DC

## 2014-07-12 MED ORDER — NORETHINDRON-ETHINYL ESTRAD-FE 1-20/1-30/1-35 MG-MCG PO TABS: 1.0000 | ORAL_TABLET | Freq: Every day | ORAL | Status: DC

## 2014-07-12 NOTE — Progress Notes (Signed)
Chief Complaint  Patient presents with  . Annual Exam    nonfasting annual exam with pap. Does have two concerns. 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   Hannah Brown is a 23 y.o. female who presents for a complete physical.  She has the following concerns:  She has trouble concentrating in class.  The classes are very long (8a - 6p, with a 2 hour break). She gets anxious when studying for tests, feels like she didn't learn things well enough in class.  Mainly gets anxious in studying for tests.  Grades have been fine (struggles a little with pharmacology). She doesn't procrastinate or cram--studies all week prior to the test, but is anxious when studying. She sleeps well at night.  Some nights she is up at 5am for clinicals at 6am, usually wakes up at 6; goes to bed by 10.  Acne is well controlled with the topical foam and OCP's.  Needs refills. Contraceptive management: she is using OCP's. She is in a monogamous relationship with the same partner as last year.  They are no longer using condoms.  Immunization History  Administered Date(s) Administered  . DTaP 07/31/1993, 11/06/1993, 01/24/1994, 11/06/1994, 02/23/1998  . HPV Quadrivalent 01/07/2012, 03/01/2012, 01/17/2013  . Hepatitis A 10/10/2010, 01/07/2012  . Hepatitis B 1991/05[birth year], 07/31/1993, 05/02/1994  . IPV 07/31/1993, 11/06/1993, 05/02/1994, 02/23/1998  . Influenza Split 06/27/2013, 06/10/2014  . MMR 11/06/1994, 02/23/1998  . Meningococcal Conjugate 10/10/2010  . Td 09/20/2010  . Tdap 10/11/2011   Last Pap smear: ?if had through school, vs just exam and STD check Last mammogram: never  Last colonoscopy: never  Last DEXA: never  Dentist: twice yearly  Ophtho: every 2 years, wears glasses. She got contacts about 6 months ago. Exercise: She is no longer getting daily exercise  (used to walk or do elliptical)--doesn't have the time.  Lab Results  Component Value Date   CHOL 195* 03/01/2012   HDL 53 03/01/2012   LDLCALC 120* 03/01/2012   TRIG 111 03/01/2012   CHOLHDL 3.7 03/01/2012   Past Medical History  Diagnosis Date  . Acne   . H/O varicella     History reviewed. No pertinent past surgical history.  History   Social History  . Marital Status: Single    Spouse Name: N/A    Number of Children: N/A  . Years of Education: N/A   Occupational History  . student    Social History Main Topics  . Smoking status: Never Smoker   . Smokeless tobacco: Never Used  . Alcohol Use: 0.0 oz/week    0 Not specified per week     Comment: 0-3 drinks per week  . Drug Use: No  . Sexual Activity:    Partners: Male    Birth Control/ Protection: Pill   Other Topics Concern  . Not on file   Social History Narrative   Ship broker at [employer], studying nursing.     Family History  Problem Relation Age of Onset  . Hyperlipidemia Father   . Diabetes Maternal Grandmother   . Diabetes Paternal Grandmother   . Cancer Paternal Aunt     thyroid cancer  . Diabetes Paternal Aunt     Outpatient Encounter Prescriptions as of 07/12/2014  Medication Sig  . Multiple Vitamins-Minerals (MULTIVITAMIN WITH MINERALS) tablet Take 1 tablet by mouth daily.  . norethindrone-ethinyl estradiol-iron (TRI-LEGEST  FE) 1-20/1-30/1-35 MG-MCG tablet Take 1 tablet by mouth daily.  . Probiotic Product (PROBIOTIC DAILY) CAPS Take 1 capsule by mouth daily.  . [DISCONTINUED] azithromycin (ZITHROMAX) 500 MG tablet Take 2 tablets (1,000 mg total) by mouth once.  . [DISCONTINUED] Clindamycin Phosphate foam Apply to face once daily  . [DISCONTINUED] fluconazole (DIFLUCAN) 150 MG tablet Take one tablet by mouth if needed for yeast infection  . [DISCONTINUED] levofloxacin (LEVAQUIN) 500 MG tablet Take 1 tablet (500 mg total) by mouth daily.  . [DISCONTINUED] metroNIDAZOLE (FLAGYL)  500 MG tablet Take 1 tablet (500 mg total) by mouth 2 (two) times daily.  . [DISCONTINUED] vitamin C (ASCORBIC ACID) 500 MG tablet Take 500 mg by mouth daily.    Allergies  Allergen Reactions  . Minocycline Hives and Other (See Comments)    Itchy throat   ROS: The patient denies anorexia, fever, headaches, vision changes, decreased hearing, ear pain, sore throat, breast concerns, chest pain, palpitations, dizziness, syncope, dyspnea on exertion, cough, swelling, nausea, vomiting, diarrhea, constipation, abdominal pain, melena, hematochezia, indigestion/heartburn, hematuria, incontinence, dysuria, irregular menstrual cycles, genital lesions, vaginal discharge, joint pains, numbness, tingling, weakness, tremor, suspicious skin lesions, abnormal bleeding/bruising, or enlarged lymph nodes. Denies any weight changes (3# noted here)--clothes still fit the same. +acne--much improved. +anxiety related to tests, as per HPI.  No depression.  PHYSICAL EXAM: BP 112/72 mmHg  Pulse 88  Ht 5' 3"  (1.6 m)  Wt 153 lb (69.4 kg)  BMI 27.11 kg/m2  LMP 07/04/2014  General Appearance:  Alert, cooperative, no distress, appears stated age   Head:  Normocephalic, without obvious abnormality, atraumatic   Eyes:  PERRL, conjunctiva/corneas clear, EOM's intact, fundi  benign   Ears:  Normal TM's and external ear canals   Nose:  Nares normal, mucosa normal, no drainage or sinus tenderness   Throat:  Lips, mucosa, and tongue normal; teeth and gums normal   Neck:  Supple, no lymphadenopathy; thyroid: no enlargement/tenderness/nodules; no carotid  bruit or JVD   Back:  Spine nontender, no curvature, ROM normal, no CVA tenderness   Lungs:  Clear to auscultation bilaterally without wheezes, rales or ronchi; respirations unlabored   Chest Wall:  No tenderness or deformity   Heart:  Regular rate and rhythm, S1 and S2 normal, no murmur, rub  or gallop   Breast Exam:  No tenderness, masses, or  nipple discharge or inversion. R nipple is pierced. 2 small papules (one of which is a little rough) on areola near piercings-just "popped up" 2 days ago, nontender. No axillary lymphadenopathy   Abdomen:  Soft, non-tender, nondistended, normoactive bowel sounds,  no masses, no hepatosplenomegaly   Genitalia:  Normal external genitalia without lesions. BUS/vagina normal. No abnormal discharge.  Cervix is notable for ectropion, not riable; no cervical motion tenderness.  Normal uterus, adnexa, without masses or tenderness.  Pap was performed.  Rectal:  Not performed due to age<40 and no related complaints   Extremities:  No clubbing, cyanosis or edema   Pulses:  2+ and symmetric all extremities   Skin:  Skin color, texture, turgor normal, no rashes or lesions.   Lymph nodes:  Cervical, supraclavicular, and axillary nodes normal   Neurologic:  CNII-XII intact, normal strength, sensation and gait; reflexes 2+ and symmetric throughout      Psych: Normal mood, affect, hygiene and grooming.    ASSESSMENT/PLAN:  Annual physical exam - Plan: Visual acuity screening, POCT Urinalysis Dipstick, Cytology - PAP [place]  Encounter for surveillance of contraceptive pills -  Plan: norethindrone-ethinyl estradiol-iron (TRI-LEGEST FE) 1-20/1-30/1-35 MG-MCG tablet  Acne vulgaris - well controlled on current regimen - Plan: Clindamycin Phosphate foam  Anxiety state - related to exams/school.  counseled re: stress reduction techniques (EXERCISE--she used to get regularly, and isn't this year)  Discussed monthly self breast exams and yearly mammograms after the age of 68; at least 30 minutes of aerobic activity at least 5 days/week; proper sunscreen use reviewed; healthy diet, including goals of calcium and vitamin D intake and alcohol recommendations (less than or equal to 1 drink/day) reviewed; regular seatbelt use; changing batteries in smoke detectors. Immunization recommendations discussed,  UTD. Reminded of need for backup contraception with antibiotic use on OCP's   Discussed stress reduction and relaxation techniques. She doesn't cram and gets adequate sleep.  Doesn't sound like ADD at all.  Lack of exercise and harder courseload contributes.  F/u 1 year, sooner prn

## 2014-07-12 NOTE — Patient Instructions (Signed)

## 2014-07-17 LAB — CYTOLOGY - PAP

## 2015-01-22 ENCOUNTER — Ambulatory Visit (INDEPENDENT_AMBULATORY_CARE_PROVIDER_SITE_OTHER): Payer: 59 | Admitting: Family Medicine

## 2015-01-22 ENCOUNTER — Encounter: Payer: Self-pay | Admitting: Family Medicine

## 2015-01-22 VITALS — BP 130/82 | HR 92 | Wt 158.6 lb

## 2015-01-22 DIAGNOSIS — Z114 Encounter for screening for human immunodeficiency virus [HIV]: Secondary | ICD-10-CM

## 2015-01-22 DIAGNOSIS — Z789 Other specified health status: Secondary | ICD-10-CM | POA: Diagnosis not present

## 2015-01-22 DIAGNOSIS — R5383 Other fatigue: Secondary | ICD-10-CM

## 2015-01-22 LAB — COMPREHENSIVE METABOLIC PANEL
ALK PHOS: 69 U/L (ref 39–117)
ALT: 13 U/L (ref 0–35)
AST: 17 U/L (ref 0–37)
Albumin: 4 g/dL (ref 3.5–5.2)
BILIRUBIN TOTAL: 0.4 mg/dL (ref 0.2–1.2)
BUN: 5 mg/dL — AB (ref 6–23)
CHLORIDE: 101 meq/L (ref 96–112)
CO2: 24 meq/L (ref 19–32)
CREATININE: 0.79 mg/dL (ref 0.50–1.10)
Calcium: 9.8 mg/dL (ref 8.4–10.5)
Glucose, Bld: 85 mg/dL (ref 70–99)
Potassium: 4.6 mEq/L (ref 3.5–5.3)
SODIUM: 135 meq/L (ref 135–145)
TOTAL PROTEIN: 7.7 g/dL (ref 6.0–8.3)

## 2015-01-22 LAB — CBC WITH DIFFERENTIAL/PLATELET
Basophils Absolute: 0 10*3/uL (ref 0.0–0.1)
Basophils Relative: 0 % (ref 0–1)
EOS ABS: 0.1 10*3/uL (ref 0.0–0.7)
Eosinophils Relative: 2 % (ref 0–5)
HCT: 37.1 % (ref 36.0–46.0)
HEMOGLOBIN: 12.6 g/dL (ref 12.0–15.0)
Lymphocytes Relative: 29 % (ref 12–46)
Lymphs Abs: 1.9 10*3/uL (ref 0.7–4.0)
MCH: 27 pg (ref 26.0–34.0)
MCHC: 34 g/dL (ref 30.0–36.0)
MCV: 79.4 fL (ref 78.0–100.0)
MONO ABS: 0.5 10*3/uL (ref 0.1–1.0)
MONOS PCT: 7 % (ref 3–12)
MPV: 9.3 fL (ref 8.6–12.4)
Neutro Abs: 4.1 10*3/uL (ref 1.7–7.7)
Neutrophils Relative %: 62 % (ref 43–77)
Platelets: 358 10*3/uL (ref 150–400)
RBC: 4.67 MIL/uL (ref 3.87–5.11)
RDW: 13.1 % (ref 11.5–15.5)
WBC: 6.6 10*3/uL (ref 4.0–10.5)

## 2015-01-22 LAB — VITAMIN B12: VITAMIN B 12: 580 pg/mL (ref 211–911)

## 2015-01-22 LAB — TSH: TSH: 3.069 u[IU]/mL (ref 0.350–4.500)

## 2015-01-22 NOTE — Patient Instructions (Signed)
We will be in touch tomorrow with your labs. If they are all normal, make sure to get regular exercise, drink plenty of fluids and get enough sleep (but not too much!). I suspect low vitamin D--the question will be whether you need prescription for 2-3 months, vs just increasing your daily supplement of vitamin D.  We will let you know.

## 2015-01-22 NOTE — Progress Notes (Signed)
Chief Complaint  Patient presents with  . overly tired    overly tired for the last 2 weeks, has to have a nap through the day. she is not eating any animal products    "I'm really tired lately, I think I need B12".  She reports she fell asleep while playing a video game yesterday, and needed a nap later in the day as well.  She is getting tired later in the day, needing naps, which is very unusual for her.  She was vegan for only a month a year ago, but remained vegetarian (with some shellfish).  Went vegan again last month.  She thinks she reintroduced egg/dairy back in her diet last year due to feeling fatigued as well.   Menses are light (on OCP's).  Denies any tingling in her fingers/toes.  No changes in memory ("always bad").  Denies changes in skin/hair/temperature tolerance. Some more frequent stools, likely related to dietary changes. She has gained about 5#, hasn't been exercising recently. Denies getting inadequate sleep, she reports sleeping well and enough. She has some mild allergies, but improving. Denies depression.  PMH, PSH, SH reviewed and updated  Outpatient Encounter Prescriptions as of 01/22/2015  Medication Sig  . Clindamycin Phosphate foam Apply to skin daily  . Multiple Vitamins-Minerals (MULTIVITAMIN WITH MINERALS) tablet Take 1 tablet by mouth daily.  . norethindrone-ethinyl estradiol-iron (TRI-LEGEST FE) 1-20/1-30/1-35 MG-MCG tablet Take 1 tablet by mouth daily.  . Probiotic Product (PROBIOTIC DAILY) CAPS Take 1 capsule by mouth daily.   No facility-administered encounter medications on file as of 01/22/2015.   Allergies  Allergen Reactions  . Minocycline Hives and Other (See Comments)    Itchy throat   ROS:  See HPI--+weight gain, fatigue.  Mild allergies. No chest pain, shortness of breath, edema, palpitations (althought she reports that her resting heart rate is always a little high, never low).  No nausea, vomiting; bowel changes per HPI.  No urinary  complaints, depression, bleeding, bruising, rash or other concerns. See HPI.  PHYSICAL EXAM: BP 130/82 mmHg  Pulse 92  Wt 158 lb 9.6 oz (71.94 kg) Well developed, pleasant female,who doesn't appear tired, and is in good spirits HEENT: PERRL,EOMI, conjunctiva clear.  TM's and EAC's normal. Nasal mucosa is normal,OP clear, no sinus tenderness. Neck: No lymphadenopathy, thyromegaly or mass Heart: regular rate and rhythm without murmur Lungs: clear bilaterally Abdomen: soft,nontender, no mass Extremities: no edema Skin: no rashes, or lesions Psych: normal mood, affect, hygiene and grooming Neuro: alert and oriented. Normal gait, strength, cranial nerves  ASSESSMENT/PLAN:  Other fatigue - suspect Vitamin D deficiency, but will check for other causes of fatigue - Plan: Comprehensive metabolic panel, CBC with Differential/Platelet, Vit D  25 hydroxy (rtn osteoporosis monitoring), TSH, Vitamin B12  Screening for HIV (human immunodeficiency virus) - Plan: HIV antibody  Vegan diet - Plan: Vit D  25 hydroxy (rtn osteoporosis monitoring), Vitamin B12  Discussed differential dx--mostly suspect Vitamin D, rather than B12, being low.  Discussed sources of vitamin D in diet, and how we might replace it if low (rx vs OTC, depending on level).

## 2015-01-23 LAB — VITAMIN D 25 HYDROXY (VIT D DEFICIENCY, FRACTURES): Vit D, 25-Hydroxy: 32 ng/mL (ref 30–100)

## 2015-01-23 LAB — HIV ANTIBODY (ROUTINE TESTING W REFLEX): HIV 1&2 Ab, 4th Generation: NONREACTIVE

## 2015-08-19 ENCOUNTER — Other Ambulatory Visit: Payer: Self-pay | Admitting: Family Medicine

## 2015-08-22 ENCOUNTER — Telehealth: Payer: Self-pay

## 2015-08-22 DIAGNOSIS — Z3041 Encounter for surveillance of contraceptive pills: Secondary | ICD-10-CM

## 2015-08-22 MED ORDER — NORETHINDRON-ETHINYL ESTRAD-FE 1-20/1-30/1-35 MG-MCG PO TABS: 1.0000 | ORAL_TABLET | Freq: Every day | ORAL | Status: DC

## 2015-08-22 NOTE — Telephone Encounter (Signed)
Sent refill, will call tomorrow to schedule appt.

## 2015-08-22 NOTE — Telephone Encounter (Signed)
Ok to refill, but needs CPE scheduled and put on cancellation list

## 2015-08-22 NOTE — Telephone Encounter (Signed)
Pt called needing a refill on her norethindrone-ethinyl estradiol-iron birth control. [facility] uses the [facility] on [address], [place], [place] [id number].

## 2015-08-23 NOTE — Telephone Encounter (Signed)
Left message on VM for patient to return my call to schedule next avail CPE and be put on cx list.

## 2015-08-27 NOTE — Telephone Encounter (Signed)
Left message for patient to please call me back and schedule CPE, next available and be put on CX list.

## 2015-08-29 NOTE — Telephone Encounter (Signed)
Left another message for pt to call and schedule CPE-will send letter.

## 2015-11-01 ENCOUNTER — Other Ambulatory Visit: Payer: Self-pay | Admitting: Family Medicine

## 2015-11-01 DIAGNOSIS — Z3041 Encounter for surveillance of contraceptive pills: Secondary | ICD-10-CM

## 2015-11-02 MED ORDER — NORETHINDRON-ETHINYL ESTRAD-FE 1-20/1-30/1-35 MG-MCG PO TABS: 1.0000 | ORAL_TABLET | Freq: Every day | ORAL | Status: DC

## 2015-11-02 NOTE — Telephone Encounter (Signed)
Done and put on cancellation list

## 2015-11-02 NOTE — Addendum Note (Signed)
Addended by: Kieth BrightlyLAWSON, Dorna Mallet M on: 11/02/2015 09:34 AM   Modules accepted: Orders

## 2015-12-24 ENCOUNTER — Encounter: Payer: Self-pay | Admitting: Family Medicine

## 2015-12-24 ENCOUNTER — Ambulatory Visit (INDEPENDENT_AMBULATORY_CARE_PROVIDER_SITE_OTHER): Payer: PRIVATE HEALTH INSURANCE | Admitting: Family Medicine

## 2015-12-24 ENCOUNTER — Other Ambulatory Visit (HOSPITAL_COMMUNITY)
Admission: RE | Admit: 2015-12-24 | Discharge: 2015-12-24 | Disposition: A | Discharge status: Home / Self Care | Payer: PRIVATE HEALTH INSURANCE | Source: Ambulatory Visit | Attending: Family Medicine | Admitting: Family Medicine

## 2015-12-24 VITALS — BP 118/78 | HR 88 | Ht 63.0 in | Wt 151.4 lb

## 2015-12-24 DIAGNOSIS — Z113 Encounter for screening for infections with a predominantly sexual mode of transmission: Secondary | ICD-10-CM | POA: Diagnosis present

## 2015-12-24 DIAGNOSIS — L700 Acne vulgaris: Secondary | ICD-10-CM | POA: Diagnosis not present

## 2015-12-24 DIAGNOSIS — Z01419 Encounter for gynecological examination (general) (routine) without abnormal findings: Secondary | ICD-10-CM | POA: Diagnosis not present

## 2015-12-24 DIAGNOSIS — Z0000 Encounter for general adult medical examination without abnormal findings: Secondary | ICD-10-CM

## 2015-12-24 DIAGNOSIS — Z3041 Encounter for surveillance of contraceptive pills: Secondary | ICD-10-CM

## 2015-12-24 LAB — LIPID PANEL
CHOL/HDL RATIO: 2.9 ratio (ref <=5.0)
Cholesterol: 208 mg/dL — ABNORMAL HIGH (ref 125–200)
HDL: 71 mg/dL (ref >=46)
LDL Cholesterol: 111 mg/dL (ref <130)
Triglycerides: 129 mg/dL (ref <150)
VLDL: 26 mg/dL (ref <30)

## 2015-12-24 LAB — CBC WITH DIFFERENTIAL/PLATELET
BASOS ABS: 0 {cells}/uL (ref 0–200)
Basophils Relative: 0 %
EOS ABS: 219 {cells}/uL (ref 15–500)
EOS PCT: 3 %
HEMATOCRIT: 36.3 % (ref 35.0–45.0)
HEMOGLOBIN: 12.2 g/dL (ref 11.7–15.5)
LYMPHS ABS: 2117 {cells}/uL (ref 850–3900)
Lymphocytes Relative: 29 %
MCH: 27.5 pg (ref 27.0–33.0)
MCHC: 33.6 g/dL (ref 32.0–36.0)
MCV: 81.8 fL (ref 80.0–100.0)
MONO ABS: 511 {cells}/uL (ref 200–950)
MPV: 10.5 fL (ref 7.5–12.5)
Monocytes Relative: 7 %
NEUTROS ABS: 4453 {cells}/uL (ref 1500–7800)
Neutrophils Relative %: 61 %
Platelets: 312 10*3/uL (ref 140–400)
RBC: 4.44 MIL/uL (ref 3.80–5.10)
RDW: 13.4 % (ref 11.0–15.0)
WBC: 7.3 10*3/uL (ref 4.0–10.5)

## 2015-12-24 LAB — POCT URINALYSIS DIPSTICK
BILIRUBIN UA: NEGATIVE
Glucose, UA: NEGATIVE
Ketones, UA: NEGATIVE
LEUKOCYTES UA: NEGATIVE
Nitrite, UA: NEGATIVE
PH UA: 7
PROTEIN UA: NEGATIVE
RBC UA: NEGATIVE
SPEC GRAV UA: 1.015
Urobilinogen, UA: NEGATIVE

## 2015-12-24 MED ORDER — ADAPALENE-BENZOYL PEROXIDE 0.1-2.5 % EX GEL: CUTANEOUS | Status: DC

## 2015-12-24 MED ORDER — NORETHINDRON-ETHINYL ESTRAD-FE 1-20/1-30/1-35 MG-MCG PO TABS: 1.0000 | ORAL_TABLET | Freq: Every day | ORAL | Status: DC

## 2015-12-24 MED ORDER — CLINDAMYCIN PHOSPHATE 1 % EX FOAM: CUTANEOUS | Status: DC

## 2015-12-24 NOTE — Patient Instructions (Signed)

## 2015-12-24 NOTE — Progress Notes (Signed)
Chief Complaint  Patient presents with  . Annual Exam    fasting annual exam with pap (last one 06/2014). Did not do eye exam as she wants to scheduled with [facility].    Hannah Brown is a 25 y.o. female who presents for a complete physical.  She has the following concerns:  Acne--acne started flaring after she started working nights.  She found an old rx for epiduo from a dermatologist in the past, and started using this in addition to the clindamycin foam.  Skin is much better now, asking for refills of both. She continues on OCP's.  Contraceptive management: she is using OCP's. She is in a monogamous relationship with the same partner--she got engaged in November. Not using condoms. Doesn't desire pregnancy at this time.   Immunization History  Administered Date(s) Administered  . DTaP 07/31/1993, 11/06/1993, 01/24/1994, 11/06/1994, 02/23/1998  . HPV Quadrivalent 01/07/2012, 03/01/2012, 01/17/2013  . Hepatitis A 10/10/2010, 01/07/2012  . Hepatitis B [birth year] 09, 1993, 07/31/1993, 05/02/1994  . IPV 07/31/1993, 11/06/1993, 05/02/1994, 02/23/1998  . Influenza Split 06/27/2013, 06/10/2014  . MMR 11/06/1994, 02/23/1998  . Meningococcal Conjugate 10/10/2010  . Td 09/20/2010  . 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 Last Pap smear: 06/2014 Last mammogram: never  Last colonoscopy: never  Last DEXA: never  Dentist: twice yearly  Ophtho: yearly, wears contacts.  Last went summer 2016. Exercise: irregular  Past Medical History  Diagnosis Date  . Acne   . H/O varicella     History reviewed. No pertinent past surgical history.  Social History   Social History  . Marital Status: Single    Spouse Name: N/A  . Number of Children: N/A  . Years of Education: N/A   Occupational History  . RN [employer]   Social History Main Topics  . Smoking status: Never Smoker   . Smokeless tobacco: Never Used  . Alcohol Use: 0.0 oz/week    0  Standard drinks or equivalent per week     Comment: 0-3 drinks per week  . Drug Use: No  . Sexual Activity:    Partners: Male    Birth Control/ Protection: Pill   Other Topics Concern  . Not on file   Social History Narrative   Graduated 07/2015 from [employer]; Therapist, sports, working Research scientist (medical) at [employer].  Engaged 06/2015. Lives with fiance, 1 dog (mini-schnauzer).   Vegan diet    Family History  Problem Relation Age of Onset  . Hyperlipidemia Father   . Diabetes Maternal Grandmother   . Kidney disease Maternal Grandmother   . Diabetes Paternal Grandmother   . Cancer Paternal Aunt     thyroid cancer  . Diabetes Paternal Aunt     Outpatient Encounter Prescriptions as of 12/24/2015  Medication Sig  . Adapalene-Benzoyl Peroxide 0.1-2.5 % gel Apply to face at bedtime  . Clindamycin Phosphate foam Apply to skin daily  . diphenhydrAMINE (BENADRYL) 25 MG tablet Take 25 mg by mouth every 6 (six) hours as needed.  . loratadine (CLARITIN) 10 MG tablet Take 10 mg by mouth daily.  . Melatonin 5 MG TABS Take 1 tablet by mouth as needed.  . Multiple Vitamins-Minerals (MULTIVITAMIN WITH MINERALS) tablet Take 1 tablet by mouth daily.  . norethindrone-ethinyl estradiol-iron (TRI-LEGEST FE) 1-20/1-30/1-35 MG-MCG tablet Take 1 tablet by mouth daily.  . [DISCONTINUED] Adapalene-Benzoyl Peroxide 0.1-2.5 % gel Apply topically at bedtime.  . [DISCONTINUED] Clindamycin Phosphate foam Apply to skin daily  . [DISCONTINUED]  norethindrone-ethinyl estradiol-iron (TRI-LEGEST FE) 1-20/1-30/1-35 MG-MCG tablet Take 1 tablet by mouth daily.  . [DISCONTINUED] Probiotic Product (PROBIOTIC DAILY) CAPS Take 1 capsule by mouth daily.   No facility-administered encounter medications on file as of 12/24/2015.    Allergies  Allergen Reactions  . Minocycline Hives and Other (See Comments)    Itchy throat    ROS: The patient denies anorexia, weight changes, fever, headaches, vision changes, decreased hearing, ear  pain, sore throat, breast concerns, chest pain, palpitations, dizziness, syncope, dyspnea on exertion, cough, swelling, nausea, vomiting, diarrhea, constipation, abdominal pain, melena, hematochezia, indigestion/heartburn, hematuria, incontinence, dysuria, irregular menstrual cycles (on OCP's, very light, lasting 3d), genital lesions, vaginal discharge, joint pains, numbness, tingling, weakness, tremor, suspicious skin lesions, abnormal bleeding/bruising, or enlarged lymph nodes. +acne--per HPI, controlled on current regimen Allergies--controlled with OTC meds Seen last year with fatigue--resolved.    PHYSICAL EXAM:  BP 140/90 mmHg  Pulse 88  Ht 5' 3"  (1.6 m)  Wt 151 lb 6.4 oz (68.675 kg)  BMI 26.83 kg/m2  LMP 12/14/2015  118/78 on repeat by MD  General Appearance:  Alert, cooperative, no distress, appears stated age   Head:  Normocephalic, without obvious abnormality, atraumatic   Eyes:  PERRL, conjunctiva/corneas clear, EOM's intact, fundi  benign   Ears:  Normal TM's and external ear canals   Nose:  Nares normal, mucosa normal (mild edema), no drainage or sinus tenderness   Throat:  Lips, mucosa, and tongue normal; teeth and gums normal   Neck:  Supple, no lymphadenopathy; thyroid: no enlargement/tenderness/nodules; no carotid  bruit or JVD   Back:  Spine nontender, no curvature, ROM normal, no CVA tenderness   Lungs:  Clear to auscultation bilaterally without wheezes, rales or ronchi; respirations unlabored   Chest Wall:  No tenderness or deformity   Heart:  Regular rate and rhythm, S1 and S2 normal, no murmur, rub  or gallop   Breast Exam:  No tenderness, masses, or nipple discharge or inversion. R nipple is pierced. No axillary lymphadenopathy   Abdomen:  Soft, non-tender, nondistended, normoactive bowel sounds,  no masses, no hepatosplenomegaly   Genitalia:  Normal external genitalia without lesions. BUS/vagina  normal. No abnormal discharge. Cervix is notable for ectropion, not friable; no cervical motion tenderness. Normal uterus, adnexa, without masses or tenderness. Pap was performed.  Rectal:  Not performed due to age<40 and no related complaints   Extremities:  No clubbing, cyanosis or edema   Pulses:  2+ and symmetric all extremities   Skin:  Skin color, texture, turgor normal, no rashes or lesions. mild milia around nose, otherwise clear skin  Lymph nodes:  Cervical, supraclavicular, and axillary nodes normal   Neurologic:  CNII-XII intact, normal strength, sensation and gait; reflexes 2+ and symmetric throughout    Psych: Normal mood, affect, hygiene and grooming        ASSESSMENT/PLAN:  Annual physical exam - Plan: POCT Urinalysis Dipstick, Lipid panel, CBC with Differential/Platelet, Cytology - PAP [place]  Encounter for surveillance of contraceptive pills - Plan: norethindrone-ethinyl estradiol-iron (TRI-LEGEST FE) 1-20/1-30/1-35 MG-MCG tablet  Acne vulgaris - well controlled on current regimen - Plan: Clindamycin Phosphate foam, Adapalene-Benzoyl Peroxide 0.1-2.5 % gel   Lipids (prior lipids from 2013 was prior to being vegan--new baseline, expect better), CBC (add B12 or other labs if CBC screen is abnormal).  Had normal B12, Vit D last year.   Discussed monthly self breast exams and yearly mammograms after the age of 15; at least 30 minutes of aerobic  activity at least 5 days/week, weight-bearing exercise at least 2x/wk; proper sunscreen use reviewed; healthy diet, including goals of calcium and vitamin D intake and alcohol recommendations (less than or equal to 1 drink/day) reviewed; regular seatbelt use; changing batteries in smoke detectors. Immunization recommendations discussed, UTD.

## 2015-12-26 LAB — CYTOLOGY - PAP

## 2016-01-17 HISTORY — PX: BREAST ENHANCEMENT SURGERY: SHX7

## 2016-01-24 ENCOUNTER — Other Ambulatory Visit: Payer: Self-pay | Admitting: Family Medicine

## 2016-01-24 NOTE — Telephone Encounter (Signed)
Left message for patient to call me back and let me know if this is just needing to be sent to a NEW pharmacy? [facility]/24/15 for #[phone] with 3 refills. Or is this an auto refill that is not really needed.

## 2016-03-13 ENCOUNTER — Encounter: Payer: 59 | Admitting: Family Medicine

## 2016-12-23 NOTE — Progress Notes (Deleted)
Hannah Brown is a 26 y.o. female who presents for a complete physical.  She has the following concerns:  ?still vegan?  Acne--acne started flaring after she started working nights.  Using epiduo and clindamycin foam (refilled last year, previously got from a dermatologist).   Contraceptive management: she is using OCP's. She is in a monogamous relationship with the same partner--she got engaged in November. Not using condoms. Doesn't desire pregnancy at this time.   Immunization History  Administered Date(s) Administered  . DTaP 07/31/1993, 11/06/1993, 01/24/1994, 11/06/1994, 02/23/1998  . HPV Quadrivalent 01/07/2012, 03/01/2012, 01/17/2013  . Hepatitis A 10/10/2010, 01/07/2012  . Hepatitis B Feb 12, 1993, 07/31/1993, 05/02/1994  . IPV 07/31/1993, 11/06/1993, 05/02/1994, 02/23/1998  . Influenza Split 06/27/2013, 06/10/2014  . MMR 11/06/1994, 02/23/1998  . Meningococcal Conjugate 10/10/2010  . Td 09/20/2010  . Tdap 10/11/2011   Last Pap smear: 12/2015, normal, with negative chlamydia screen. (prior pap 06/2014, normal) Last mammogram: never  Last colonoscopy: never  Last DEXA: never  Dentist: twice yearly  Ophtho: yearly, wears contacts.  Exercise: irregular Lipids: Lab Results  Component Value Date   CHOL 208 (H) 12/24/2015   HDL 71 12/24/2015   LDLCALC 111 12/24/2015   TRIG 129 12/24/2015   CHOLHDL 2.9 12/24/2015   Vitamin D-OH screen 01/2015 normal at 32    ROS: The patient denies anorexia, weight changes, fever, headaches, vision changes, decreased hearing, ear pain, sore throat, breast concerns, chest pain, palpitations, dizziness, syncope, dyspnea on exertion, cough, swelling, nausea, vomiting, diarrhea, constipation, abdominal pain, melena, hematochezia, indigestion/heartburn, hematuria, incontinence, dysuria, irregular menstrual cycles (on OCP's, very light, lasting 3d), genital lesions, vaginal discharge, joint pains, numbness, tingling, weakness, tremor,  suspicious skin lesions, abnormal bleeding/bruising, or enlarged lymph nodes. +acne--per HPI, controlled on current regimen Allergies--controlled with OTC meds   PHYSICAL EXAM:  Wt Readings from Last 3 Encounters:  12/24/15 151 lb 6.4 oz (68.7 kg)  01/22/15 158 lb 9.6 oz (71.9 kg)  07/12/14 153 lb (69.4 kg)   General Appearance:  Alert, cooperative, no distress, appears stated age   Head:  Normocephalic, without obvious abnormality, atraumatic   Eyes:  PERRL, conjunctiva/corneas clear, EOM's intact, fundi benign   Ears:  Normal TM's and external ear canals   Nose:  Nares normal, mucosa normal (mild edema), no drainage or sinus tenderness   Throat:  Lips, mucosa, and tongue normal; teeth and gums normal   Neck:  Supple, no lymphadenopathy; thyroid: no enlargement/tenderness/nodules; no carotid bruit or JVD   Back:  Spine nontender, no curvature, ROM normal, no CVA tenderness   Lungs:  Clear to auscultation bilaterally without wheezes, rales or ronchi; respirations unlabored   Chest Wall:  No tenderness or deformity   Heart:  Regular rate and rhythm, S1 and S2 normal, no murmur, rub or gallop   Breast Exam:  No tenderness, masses, or nipple discharge or inversion. R nipple is pierced. No axillary lymphadenopathy   Abdomen:  Soft, non-tender, nondistended, normoactive bowel sounds,  no masses, no hepatosplenomegaly   Genitalia:  Normal external genitalia without lesions. BUS/vagina normal. No abnormal discharge. Cervix is notable for ectropion, not friable; no cervical motion tenderness. Normal uterus, adnexa, without masses or tenderness. Pap was performed.  Rectal:  Not performed due to age<40 and no related complaints   Extremities:  No clubbing, cyanosis or edema   Pulses:  2+ and symmetric all extremities   Skin:  Skin color, texture, turgor normal, no rashes or lesions.   Lymph nodes:  Cervical,  supraclavicular, and axillary nodes normal   Neurologic:  CNII-XII intact, normal strength, sensation and gait; reflexes 2+ and symmetric throughout     Psych: Normal mood, affect, hygiene and grooming    ASSESSMENT/PLAN:  pap   Discussed monthly self breast exams and yearly mammograms after the age of 81; at least 30 minutes of aerobic activity at least 5 days/week, weight-bearing exercise at least 2x/wk; proper sunscreen use reviewed; healthy diet, including goals of calcium and vitamin D intake and alcohol recommendations (less than or equal to 1 drink/day) reviewed; regular seatbelt use; changing batteries in smoke detectors. Immunization recommendations discussed, UTD. Continue yearly flu shots.

## 2016-12-24 ENCOUNTER — Encounter: Payer: PRIVATE HEALTH INSURANCE | Admitting: Family Medicine

## 2016-12-24 ENCOUNTER — Telehealth: Payer: Self-pay | Admitting: *Deleted

## 2016-12-24 DIAGNOSIS — Z0000 Encounter for general adult medical examination without abnormal findings: Secondary | ICD-10-CM

## 2016-12-24 NOTE — Telephone Encounter (Signed)
[facility]--no show letter and fee

## 2016-12-24 NOTE — Telephone Encounter (Signed)

## 2016-12-29 ENCOUNTER — Other Ambulatory Visit: Payer: Self-pay | Admitting: Family Medicine

## 2016-12-29 ENCOUNTER — Telehealth: Payer: Self-pay | Admitting: Family Medicine

## 2016-12-29 DIAGNOSIS — L700 Acne vulgaris: Secondary | ICD-10-CM

## 2016-12-29 DIAGNOSIS — Z3041 Encounter for surveillance of contraceptive pills: Secondary | ICD-10-CM

## 2016-12-29 NOTE — Telephone Encounter (Signed)
Ok to refill all the earlier requested meds until August appt.

## 2016-12-29 NOTE — Telephone Encounter (Signed)
Patient no showed her CPE last week, I left a message asking her to call and reschedule her CPE before we can fill these. Is that what you would like me to do? Would you like a med check in between?

## 2016-12-29 NOTE — Telephone Encounter (Signed)
Pt called and made a appt for Aug. She states she will needs BC pills refilled before then. She was offered appt for this Thursday and she states she has to work. Pt uses [facility].

## 2016-12-29 NOTE — Telephone Encounter (Signed)
Deny rx's, telling them she needs to contact our office. Depending on when we get her in, we can refill until her visit.

## 2017-01-01 ENCOUNTER — Encounter: Payer: Self-pay | Admitting: Family Medicine

## 2017-01-01 NOTE — Telephone Encounter (Signed)
No show letter with fee sent °

## 2017-03-31 ENCOUNTER — Other Ambulatory Visit: Payer: Self-pay

## 2017-03-31 DIAGNOSIS — Z3041 Encounter for surveillance of contraceptive pills: Secondary | ICD-10-CM

## 2017-03-31 MED ORDER — TRI-LEGEST FE 1-20/1-30/1-35 MG-MCG PO TABS: 1.0000 | ORAL_TABLET | Freq: Every day | ORAL | 0 refills | Status: DC

## 2017-03-31 NOTE — Telephone Encounter (Signed)
Pt called and requested a refill of her BC pills she has appointment with Dr.Knapp on Monday 04/06/17 I have sent in med.

## 2017-04-05 NOTE — Progress Notes (Signed)
Chief Complaint  Patient presents with  . Annual Exam    fasting. no concerns.    Hannah Brown is a 26 y.o. female who presents for a complete physical.  She has the following concerns:  She has had a little more anxiety recently (always has had some). Not interfering with sleep. Doesn't like long lines, mostly social anxiety. Alone more, since she no longer lives with her parents, and her fiance works different hours, seems to notice a little more anxiety.  Acne--Doing very well on current regimen. Using epiduo and clindamycin foam.  This still works well for her.  Need refills.  Contraceptive management: she is using OCP's. She is in a monogamous relationship with the same partner (engaged; considering eloping in May). Not using condoms. Doesn't desire pregnancy at this time.  Immunization History  Administered Date(s) Administered  . DTaP 07/31/1993, 11/06/1993, 01/24/1994, 11/06/1994, 02/23/1998  . HPV Quadrivalent 01/07/2012, 03/01/2012, 01/17/2013  . Hepatitis A 10/10/2010, 01/07/2012  . Hepatitis B 08/07/93, 07/31/1993, 05/02/1994  . IPV 07/31/1993, 11/06/1993, 05/02/1994, 02/23/1998  . Influenza Split 06/27/2013, 06/10/2014  . MMR 11/06/1994, 02/23/1998  . Meningococcal Conjugate 10/10/2010  . Td 09/20/2010  . Tdap 10/11/2011   Gets flu shots at work (works for [employer]) Last Pap smear: 12/2015, normal, with negative chlamydia screen. (prior pap 06/2014, normal) Last mammogram: never  Last colonoscopy: never  Last DEXA: never  Dentist: twice yearly  Ophtho: yearly, wears contacts. Due now Exercise: Walks dogs, occasional hiking. Lipids: Lab Results  Component Value Date   CHOL 208 (H) 12/24/2015   HDL 71 12/24/2015   LDLCALC 111 12/24/2015   TRIG 129 12/24/2015   CHOLHDL 2.9 12/24/2015   Vitamin D-OH screen 01/2015 normal at 32  Past Medical History:  Diagnosis Date  . Acne   . H/O varicella     Past Surgical History:  Procedure Laterality Date   . BREAST ENHANCEMENT SURGERY  10/90   silicone implants    Social History   Social History  . Marital status: Single    Spouse name: N/A  . Number of children: N/A  . Years of education: N/A   Occupational History  . RN [employer]   Social History Main Topics  . Smoking status: Never Smoker  . Smokeless tobacco: Never Used  . Alcohol use 0.0 oz/week     Comment: 0-3 drinks per week  . Drug use: No  . Sexual activity: Yes    Partners: Male    Birth control/ protection: Pill   Other Topics Concern  . Not on file   Social History Narrative   Graduated 07/2015 from [employer]; Therapist, sports, working Research scientist (medical) at [employer].  Engaged 06/2015. Lives with fiance, 2 dogs (mini-schnauzers).   Vegan diet    Family History  Problem Relation Age of Onset  . Hyperlipidemia Father   . Diabetes Maternal Grandmother   . Kidney disease Maternal Grandmother   . Diabetes Paternal Grandmother   . Cancer Paternal Aunt        thyroid cancer  . Diabetes Paternal Aunt     Outpatient Encounter Prescriptions as of 04/06/2017  Medication Sig  . Clindamycin Phosphate foam APPLY TO SKIN DAILY  . EPIDUO 0.1-2.5 % gel APPLY TO FACE AT BEDTIME  . loratadine (CLARITIN) 10 MG tablet Take 10 mg by mouth daily.  . Melatonin 5 MG TABS Take 1 tablet by mouth as needed.  . Multiple Vitamins-Minerals (MULTIVITAMIN WITH MINERALS) tablet Take 1 tablet by mouth daily.  Marland Kitchen  TRI-LEGEST FE 1-20/1-30/1-35 MG-MCG tablet Take 1 tablet by mouth daily.  . vitamin B-12 (CYANOCOBALAMIN) 250 MCG tablet Take 1,000 mcg by mouth daily.  . Vitamin D, Cholecalciferol, 400 units TABS Take 400 Units by mouth daily.  . [DISCONTINUED] Clindamycin Phosphate foam APPLY TO SKIN DAILY  . [DISCONTINUED] EPIDUO 0.1-2.5 % gel APPLY TO FACE AT BEDTIME  . diphenhydrAMINE (BENADRYL) 25 MG tablet Take 25 mg by mouth every 6 (six) hours as needed.   No facility-administered encounter medications on file as of 04/06/2017.      Allergies  Allergen Reactions  . Minocycline Hives and Other (See Comments)    Itchy throat    ROS: The patient denies anorexia, fever, headaches, vision changes, decreased hearing, ear pain, sore throat, breast concerns, chest pain, palpitations, dizziness, syncope, dyspnea on exertion, cough, swelling, nausea, vomiting, diarrhea, constipation, abdominal pain, melena, hematochezia, indigestion/heartburn, hematuria, incontinence, dysuria, irregular menstrual cycles (on OCP's, very light, lasting 3d), genital lesions, vaginal discharge, joint pains, numbness, tingling, weakness, tremor, suspicious skin lesions, abnormal bleeding/bruising, or enlarged lymph nodes. +acne--per HPI, controlled on current regimen Allergies--controlled with OTC meds. Occasional heartburn (related to late night eating after working night shift) +weight gain--pants size hasn't changed.  She had breast implants since last visit.  PHYSICAL EXAM:  BP 110/74   Pulse 98   Resp 16   Ht 5' 3.75" (1.619 m)   Wt 160 lb 3.2 oz (72.7 kg)   LMP 03/31/2017   SpO2 98%   BMI 27.71 kg/m   Wt Readings from Last 3 Encounters:  04/06/17 160 lb 3.2 oz (72.7 kg)  12/24/15 151 lb 6.4 oz (68.7 kg)  01/22/15 158 lb 9.6 oz (71.9 kg)    General Appearance:  Alert, cooperative, no distress, appears stated age   Head:  Normocephalic, without obvious abnormality, atraumatic   Eyes:  PERRL, conjunctiva/corneas clear, EOM's intact, fundi benign   Ears:  Normal TM's and external ear canals   Nose:  Nares normal, mucosa normal, no drainage or sinus tenderness   Throat:  Lips, mucosa, and tongue normal; teeth and gums normal   Neck:  Supple, no lymphadenopathy; thyroid: no enlargement/tenderness/nodules; no carotid bruit or JVD   Back:  Spine nontender, no curvature, ROM normal, no CVA tenderness   Lungs:  Clear to auscultation bilaterally without wheezes, rales or ronchi; respirations unlabored    Chest Wall:  No tenderness or deformity   Heart:  Regular rate and rhythm, S1 and S2 normal, no murmur, rub or gallop   Breast Exam:  No tenderness, masses, or nipple discharge or inversion. R nipple is pierced. Bilateral implants present, soft, nontender.  She has bandages covering scars along inferior aspect of breast (reportedly to help prevent keloids). No axillary lymphadenopathy   Abdomen:  Soft, non-tender, nondistended, normoactive bowel sounds,  no masses, no hepatosplenomegaly. Umbilical piercing  Genitalia:  Normal external genitalia without lesions. BUS/vagina normal. No abnormal discharge. Cervix is notable for ectropion, not friable; no cervical motion tenderness. Normal uterus, adnexa, without masses or tenderness. Pap was performed.  Rectal:  Not performed due to age<40 and no related complaints   Extremities:  No clubbing, cyanosis or edema   Pulses:  2+ and symmetric all extremities   Skin:  Skin color, texture, turgor normal, no rashes or lesions. Skin is clear, no current acne seen  Lymph nodes:  Cervical, supraclavicular, and axillary nodes normal   Neurologic:  CNII-XII intact, normal strength, sensation and gait; reflexes 2+ and symmetric throughout  Psych:   Normal mood, affect, hygiene and grooming   ASSESSMENT/PLAN:  Annual physical exam - Plan: POCT Urinalysis DIP (Proadvantage Device), Cytology - PAP [place]  Encounter for surveillance of contraceptive pills - refilled a few days ago as she was going to run out. Doing well, continue  Acne vulgaris - well controlled on current regimen - Plan: EPIDUO 0.1-2.5 % gel, Clindamycin Phosphate foam  Anxiety state - mild social anxiety.  Discussed/counseled.  Consider counseling if worsening.   Pap this year.  If normal, check every 3 years No labs needed this year.   Discussed monthly self breast exams and yearly mammograms after the age  of 6; at least 30 minutes of aerobic activity at least 5 days/week, weight-bearing exercise at least 2x/wk; proper sunscreen use reviewed; healthy diet, including goals of calcium and vitamin D intake and alcohol recommendations (less than or equal to 1 drink/day) reviewed; regular seatbelt use; changing batteries in smoke detectors. Immunization recommendations discussed, UTD. Continue yearly flu shots. Pap today

## 2017-04-06 ENCOUNTER — Encounter: Payer: Self-pay | Admitting: Family Medicine

## 2017-04-06 ENCOUNTER — Ambulatory Visit (INDEPENDENT_AMBULATORY_CARE_PROVIDER_SITE_OTHER): Payer: PRIVATE HEALTH INSURANCE | Admitting: Family Medicine

## 2017-04-06 ENCOUNTER — Other Ambulatory Visit (HOSPITAL_COMMUNITY)
Admission: RE | Admit: 2017-04-06 | Discharge: 2017-04-06 | Disposition: A | Discharge status: Home / Self Care | Payer: PRIVATE HEALTH INSURANCE | Source: Ambulatory Visit | Attending: Family Medicine | Admitting: Family Medicine

## 2017-04-06 VITALS — BP 110/74 | HR 98 | Resp 16 | Ht 63.75 in | Wt 160.2 lb

## 2017-04-06 DIAGNOSIS — Z0000 Encounter for general adult medical examination without abnormal findings: Secondary | ICD-10-CM | POA: Diagnosis not present

## 2017-04-06 DIAGNOSIS — L700 Acne vulgaris: Secondary | ICD-10-CM | POA: Diagnosis not present

## 2017-04-06 DIAGNOSIS — F411 Generalized anxiety disorder: Secondary | ICD-10-CM | POA: Diagnosis not present

## 2017-04-06 DIAGNOSIS — Z3041 Encounter for surveillance of contraceptive pills: Secondary | ICD-10-CM | POA: Diagnosis not present

## 2017-04-06 DIAGNOSIS — Z01419 Encounter for gynecological examination (general) (routine) without abnormal findings: Secondary | ICD-10-CM | POA: Diagnosis not present

## 2017-04-06 LAB — POCT URINALYSIS DIP (PROADVANTAGE DEVICE)
Bilirubin, UA: NEGATIVE
Blood, UA: NEGATIVE
Glucose, UA: NEGATIVE mg/dL
Ketones, POC UA: NEGATIVE mg/dL
Nitrite, UA: NEGATIVE
PH UA: 7.5 (ref 5.0–8.0)
PROTEIN UA: NEGATIVE mg/dL
SPECIFIC GRAVITY, URINE: 1.015
UUROB: NEGATIVE

## 2017-04-06 MED ORDER — EPIDUO 0.1-2.5 % EX GEL: CUTANEOUS | 1 refills | Status: DC

## 2017-04-06 MED ORDER — CLINDAMYCIN PHOSPHATE 1 % EX FOAM: CUTANEOUS | 1 refills | Status: DC

## 2017-04-06 NOTE — Patient Instructions (Signed)

## 2017-04-09 LAB — CYTOLOGY - PAP: DIAGNOSIS: NEGATIVE

## 2017-05-25 ENCOUNTER — Other Ambulatory Visit: Payer: Self-pay | Admitting: Family Medicine

## 2017-05-25 DIAGNOSIS — Z3041 Encounter for surveillance of contraceptive pills: Secondary | ICD-10-CM

## 2017-05-25 MED ORDER — TRI-LEGEST FE 1-20/1-30/1-35 MG-MCG PO TABS: 1.0000 | ORAL_TABLET | Freq: Every day | ORAL | 2 refills | Status: DC

## 2017-12-28 ENCOUNTER — Ambulatory Visit: Payer: PRIVATE HEALTH INSURANCE | Admitting: Family Medicine

## 2017-12-28 ENCOUNTER — Encounter: Payer: Self-pay | Admitting: Family Medicine

## 2017-12-28 VITALS — BP 120/80 | HR 92 | Temp 98.5°F | Ht 63.75 in | Wt 158.4 lb

## 2017-12-28 DIAGNOSIS — R109 Unspecified abdominal pain: Secondary | ICD-10-CM

## 2017-12-28 LAB — POCT URINALYSIS DIP (PROADVANTAGE DEVICE)
Bilirubin, UA: NEGATIVE
GLUCOSE UA: NEGATIVE mg/dL
Ketones, POC UA: NEGATIVE mg/dL
LEUKOCYTES UA: NEGATIVE
NITRITE UA: NEGATIVE
Protein Ur, POC: NEGATIVE mg/dL
Specific Gravity, Urine: 1.02
UUROB: NEGATIVE
pH, UA: 6 (ref 5.0–8.0)

## 2017-12-28 NOTE — Patient Instructions (Signed)
  Take aleve twice daily with food for a week. You can also try heating pad (vs ice) prn for pain/cramping. Return for re-evaluation if change in vaginal discharge, worsening symptoms, fever, or other changes as we discussed. If not improving with these measures, we will do pelvic exam, look at the discharge under the microscope, and get some samples for testing.

## 2017-12-28 NOTE — Progress Notes (Signed)
Chief Complaint  Patient presents with  . Abdominal Pain    x 2 weeks. Not really urinary symptoms. Did take pregnancy test about a week ago and it was negative-has not missed any periods.    Patient presents with possible urinary tract infection.  She has had 2 weeks of lower abdominal discomfort, felt like flutters at first, but now feels like cramping. Fairly constant. 1/10 in intensity, very mild. No change in activity/exercise. Denies urgency, frequency (just related to spironolactone), dysuria. Bowels are normal, twice a day, no constipation or diarrhea.  Always has some vaginal discharge, not really much different. Slight odor, "scent" that is different, but not fishy or bad. In a monogamous relationship, no h/o STD's, single partner. Getting married next week.  Having more intercourse recently (which is what she accounted for the change in scent).   LMP 2.5 weeks ago, normal, always light.  On OCP's, no missed pills. Negative home pregnancy test.  PMH, PSH, SH reviewed  Outpatient Encounter Medications as of 12/28/2017  Medication Sig  . diphenhydrAMINE (BENADRYL) 25 MG tablet Take 25 mg by mouth every 6 (six) hours as needed.  Marland Kitchen EPIDUO 0.1-2.5 % gel APPLY TO FACE AT BEDTIME  . Melatonin 5 MG TABS Take 1 tablet by mouth as needed.  . Multiple Vitamins-Minerals (MULTIVITAMIN WITH MINERALS) tablet Take 1 tablet by mouth daily.  Marland Kitchen spironolactone (ALDACTONE) 50 MG tablet Take 50 mg by mouth daily.  . TRI-LEGEST FE 1-20/1-30/1-35 MG-MCG tablet Take 1 tablet by mouth daily.  . Vitamin D, Cholecalciferol, 400 units TABS Take 400 Units by mouth daily.  . [DISCONTINUED] spironolactone (ALDACTONE) 50 MG tablet Take by mouth.  . [DISCONTINUED] vitamin B-12 (CYANOCOBALAMIN) 250 MCG tablet Take 1,000 mcg by mouth daily.  Marland Kitchen loratadine (CLARITIN) 10 MG tablet Take 10 mg by mouth daily.  . [DISCONTINUED] Clindamycin Phosphate foam APPLY TO SKIN DAILY   No facility-administered encounter  medications on file as of 12/28/2017.    Allergies  Allergen Reactions  . Minocycline Hives and Other (See Comments)    Itchy throat   ROS: no fever, chills, dizziness, nausea, vomiting, change in bowels, urinary complaints.  Lower abdominal pain per HPI. No bleeding, bruising.  No URI complaints.  PHYSICAL EXAM:  BP 120/80   Pulse 92   Temp 98.5 F (36.9 C) (Tympanic)   Ht 5' 3.75" (1.619 m)   Wt 158 lb 6.4 oz (71.8 kg)   LMP 12/08/2017 (Exact Date)   BMI 27.40 kg/m   Well appearing, pleasant female in no distress HEENT: conjunctiva and sclera clear, EOMI Neck: no lymphadenopathy or mass Heart: regular rate and rhythm Back: no CVA tenderness Abdomen: soft, normal bowel sounds.  Mildly tender in suprapubic region, and pubic bone.  No masses. No rebound or guarding Extremities: no edema Skin: normal turgor, no rash  Urine dip: trace blood, neg nitrite, leuks  ASSESSMENT/PLAN:  Abdominal pain, unspecified abdominal location - suprapubic pain x 2wks, mild; normal urine dip, no abnl DC. Tender over pubic bonel,?MSK. Trial Aleve BID, warm compress. fu for pelvic exam if persists - Plan: POCT Urinalysis DIP (Proadvantage Device)   Take aleve twice daily with food for a week. You can also try heating pad (vs ice) prn for pain/cramping. Return for re-evaluation if change in vaginal discharge, worsening symptoms, fever, or other changes as we discussed. If not improving with these measures, we will do pelvic exam, look at the discharge under the microscope, and get some samples for testing.

## 2018-01-28 ENCOUNTER — Other Ambulatory Visit: Payer: Self-pay | Admitting: Family Medicine

## 2018-01-28 DIAGNOSIS — Z3041 Encounter for surveillance of contraceptive pills: Secondary | ICD-10-CM

## 2018-04-07 ENCOUNTER — Encounter: Payer: PRIVATE HEALTH INSURANCE | Admitting: Family Medicine

## 2018-04-07 NOTE — Patient Instructions (Signed)

## 2018-04-07 NOTE — Progress Notes (Signed)
Chief Complaint  Patient presents with  . Annual Exam    fasting annual exam with pap-will have eyes checked at eye doctor. Started with a scratchy throat and nasal congestion 4 days ago-has been taking Day Quil. Thinks she is at the end of it though, mucus was thick and yellow but is now clear.   . Flu Vaccine    will get at work.     Hannah Brown is a 27 y.o. female who presents for a complete physical.  She has the following concerns:  URI symptoms x 4 days, improving. Mucus is now clear.  She has had elevated BP's at the dermatologist (130's/high 80's), and high when she had her wisdom teeth removed (140/90).  Denies headaches, chest pain.  Last seen in May with suprapubic complaints, normal urine, felt to possibly be MSK.  This completely resolved. She has some intermittent vaginal discharge, white, somewhat thick, no odor, no itching, no pelvic pain.  Under the care of dermatologist in [place] for acne.  On Spironolactone, Epiduo. Had discussed Accutane, to consider in the future, has f/u in October.  Has been very pleased with current treatment, denies side effects. Labs not monitored there.  Contraceptive management: she is using OCP's. She is in a monogamous relationship with the same partner, got married 12/2017 and just closed on a chouse.   Immunization History  Administered Date(s) Administered  . DTaP 07/31/1993, 11/06/1993, 01/24/1994, 11/06/1994, 02/23/1998  . HPV Quadrivalent 01/07/2012, 03/01/2012, 01/17/2013  . Hepatitis A 10/10/2010, 01/07/2012  . Hepatitis B 09[birth year]02[birth year]92, 07/31/1993, 05/02/1994  . IPV 07/31/1993, 11/06/1993, 05/02/1994, 02/23/1998  . Influenza Split 06/27/2013, 06/10/2014  . MMR 11/06/1994, 02/23/1998  . Meningococcal Conjugate 10/10/2010  . Td 09/20/2010  . Tdap 10/11/2011   Gets flu shots at work (works for [employer]) Last Pap smear: 03/2017, normal (also normal 2017, 2015) Last mammogram: never  Last colonoscopy: never  Last DEXA: never   Dentist: twice yearly  Ophtho: yearly, wears contacts/glasses. Exercise: Walks dogs (3) 15 minutes in the morning, slower in the afternoon, occasional hiking. Lipids: Lab Results  Component Value Date   CHOL 208 (H) 12/24/2015   HDL 71 12/24/2015   LDLCALC 111 12/24/2015   TRIG 129 12/24/2015   CHOLHDL 2.9 12/24/2015   Vitamin D-OH screen 01/2015 normal at 34  Vegan diet.  Past Medical History:  Diagnosis Date  . Acne   . H/O varicella     Past Surgical History:  Procedure Laterality Date  . BREAST ENHANCEMENT SURGERY  44/3154   silicone implants    Social History   Socioeconomic History  . Marital status: Single    Spouse name: Not on file  . Number of children: Not on file  . Years of education: Not on file  . Highest education level: Not on file  Occupational History  . Occupation: Programmer, multimedia: [employer]  . Financial resource strain: Not on file  . Food insecurity:    Worry: Not on file    Inability: Not on file  . Transportation needs:    Medical: Not on file    Non-medical: Not on file  Tobacco Use  . Smoking status: Never Smoker  . Smokeless tobacco: Never Used  Substance and Sexual Activity  . Alcohol use: Yes    Alcohol/week: 0.0 standard drinks    Comment: 3 glasses of wine/week  . Drug use: No  . Sexual activity: Yes    Partners: Male  Birth control/protection: Pill  Lifestyle  . Physical activity:    Days per week: Not on file    Minutes per session: Not on file  . Stress: Not on file  Relationships  . Social connections:    Talks on phone: Not on file    Gets together: Not on file    Attends religious service: Not on file    Active member of club or organization: Not on file    Attends meetings of clubs or organizations: Not on file    Relationship status: Not on file  . Intimate partner violence:    Fear of current or ex partner: Not on file    Emotionally abused: Not on file    Physically abused: Not  on file    Forced sexual activity: Not on file  Other Topics Concern  . 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 Lives with husband, 3 dogs (mini-schnauzers--the 2 original had a puppy).   Vegan diet    Family History  Problem Relation Age of Onset  . Hyperlipidemia Father   . Diabetes Maternal Grandmother   . Kidney disease Maternal Grandmother   . Diabetes Paternal Grandmother   . Cancer Paternal Aunt        thyroid cancer  . Diabetes Paternal Aunt     Outpatient Encounter Medications as of 04/09/2018  Medication Sig Note  . EPIDUO 0.1-2.5 % gel APPLY TO FACE AT BEDTIME   . Melatonin 5 MG TABS Take 1 tablet by mouth as needed. 04/09/2018: Uses the morning after getting off shift at work  . Multiple Vitamins-Minerals (MULTIVITAMIN WITH MINERALS) tablet Take 1 tablet by mouth daily.   Marland Kitchen spironolactone (ALDACTONE) 50 MG tablet Take 50 mg by mouth daily.   . TRI-LEGEST FE 1-20/1-30/1-35 MG-MCG tablet TAKE 1 TABLET BY MOUTH DAILY.   . diphenhydrAMINE (BENADRYL) 25 MG tablet Take 25 mg by mouth every 6 (six) hours as needed.   . loratadine (CLARITIN) 10 MG tablet Take 10 mg by mouth daily.   . [DISCONTINUED] Vitamin D, Cholecalciferol, 400 units TABS Take 400 Units by mouth daily.    No facility-administered encounter medications on file as of 04/09/2018.     Allergies  Allergen Reactions  . Minocycline Hives and Other (See Comments)    Itchy throat    ROS: The patient denies anorexia, fever, headaches, vision changes, decreased hearing, ear pain, sore throat, breast concerns, chest pain, palpitations, dizziness, syncope, dyspnea on exertion, cough, swelling, nausea, vomiting, diarrhea, constipation, abdominal pain, melena, hematochezia, indigestion/heartburn, hematuria, incontinence, dysuria, irregular menstrual cycles (on OCP's, very light, lasting 3d), genital lesions,  vaginal discharge, joint pains, numbness, tingling, weakness, tremor, suspicious skin lesions, abnormal bleeding/bruising, or enlarged lymph nodes. +acne--per HPI, controlled on current regimen Seasonal allergies--controlled with OTC meds when needed, no symptoms now. Occasional heartburn (related to late night eating after working night shift) URI symptoms x 4 days, resolving. No fever, mucus is clear, no sore throat. White vaginal discharge, fluctuates throughout cycle, no odor/itch or pelvic pain.   PHYSICAL EXAM:  BP 124/90   Pulse 80   Temp 98.8 F (37.1 C) (Tympanic)   Ht 5' 3.5" (1.613 m)   Wt 156 lb 9.6 oz (71 kg)   LMP 03/23/2018 (Approximate)   BMI 27.31 kg/m    120/84 on repeat by MD  Wt Readings from Last 3 Encounters:  04/09/18 156 lb  9.6 oz (71 kg)  12/28/17 158 lb 6.4 oz (71.8 kg)  04/06/17 160 lb 3.2 oz (72.7 kg)   BP Readings from Last 3 Encounters:  04/09/18 124/90  12/28/17 120/80  04/06/17 110/74     General Appearance:  Alert, cooperative, no distress, appears stated age   Head:  Normocephalic, without obvious abnormality, atraumatic   Eyes:  PERRL, conjunctiva/corneas clear, EOM's intact, fundi benign   Ears:  Normal TM's and external ear canals   Nose:  Nares normal, mucosa mild-mod edematous, no drainage or sinus tenderness   Throat:  Lips, mucosa, and tongue normal; teeth and gums normal   Neck:  Supple, no lymphadenopathy; thyroid: no enlargement/ tenderness/nodules; no carotid bruit or JVD   Back:  Spine nontender, no curvature, ROM normal, no CVA tenderness   Lungs:  Clear to auscultation bilaterally without wheezes, rales or ronchi; respirations unlabored   Chest Wall:  No tenderness or deformity   Heart:  Regular rate and rhythm, S1 and S2 normal, no murmur, rub or gallop   Breast Exam:  No tenderness, masses, or nipple discharge or inversion. R nipple is pierced. Bilateral implants present, soft,  nontender. WHSS bilateral inferior breasts. No axillary lymphadenopathy   Abdomen:  Soft, non-tender, nondistended, normoactive bowel sounds,  no masses, no hepatosplenomegaly. Umbilical piercing  Genitalia:  Normal external genitalia without lesions. BUS/vagina normal. Small amount of thin white discharge, no odor. No cervical lesions or discharge. No cervical motion tenderness. Normal uterus, adnexa, without masses or tenderness. Pap was not performed.  Rectal:  Not performed due to age<40 and no related complaints   Extremities:  No clubbing, cyanosis or edema   Pulses:  2+ and symmetric all extremities   Skin:  Skin color, texture, turgor normal, no rashes or lesions. Skin is clear, no current acne seen  Lymph nodes:  Cervical, supraclavicular, and axillary nodes normal   Neurologic:  CNII-XII intact, normal strength, sensation and gait; reflexes 2+ and symmetric throughout    Psych:   Normal mood, affect, hygiene and grooming   ASSESSMENT/PLAN:  Annual physical exam - Plan: POCT Urinalysis DIP (Proadvantage Device), Basic metabolic panel, CBC with Differential/Platelet  Encounter for surveillance of contraceptive pills  Medication monitoring encounter - Plan: Basic metabolic panel  Acne vulgaris  Reassured re: vaginal discharge--appears normal/physiologic (possibly somewhat different related to her OCP's/meds).  b-met, CBC Declines HIV, chlamydia, low risk   Discussed monthly self breast exams and yearly mammograms after the age of 57; at least 30 minutes of aerobic activity at least 5 days/week, weight-bearing exercise at least 2x/wk; proper sunscreen use reviewed; healthy diet, including goals of calcium and vitamin D intake and alcohol recommendations (less than or equal to 1 drink/day) reviewed; regular seatbelt use; changing batteries in smoke detectors. Immunization recommendations discussed, UTD. Continue yearly flu  shots.

## 2018-04-09 ENCOUNTER — Encounter: Payer: Self-pay | Admitting: Family Medicine

## 2018-04-09 ENCOUNTER — Ambulatory Visit: Payer: PRIVATE HEALTH INSURANCE | Admitting: Family Medicine

## 2018-04-09 VITALS — BP 120/84 | HR 80 | Temp 98.8°F | Ht 63.5 in | Wt 156.6 lb

## 2018-04-09 DIAGNOSIS — Z5181 Encounter for therapeutic drug level monitoring: Secondary | ICD-10-CM

## 2018-04-09 DIAGNOSIS — Z3041 Encounter for surveillance of contraceptive pills: Secondary | ICD-10-CM

## 2018-04-09 DIAGNOSIS — Z0000 Encounter for general adult medical examination without abnormal findings: Secondary | ICD-10-CM

## 2018-04-09 DIAGNOSIS — L700 Acne vulgaris: Secondary | ICD-10-CM | POA: Diagnosis not present

## 2018-04-09 LAB — POCT URINALYSIS DIP (PROADVANTAGE DEVICE)
BILIRUBIN UA: NEGATIVE
BILIRUBIN UA: NEGATIVE mg/dL
Blood, UA: NEGATIVE
Glucose, UA: NEGATIVE mg/dL
NITRITE UA: NEGATIVE
PH UA: 6 (ref 5.0–8.0)
Protein Ur, POC: NEGATIVE mg/dL
Specific Gravity, Urine: 1.015
Urobilinogen, Ur: NEGATIVE

## 2018-04-09 LAB — BASIC METABOLIC PANEL
BUN / CREAT RATIO: 9 (ref 9–23)
BUN: 6 mg/dL (ref 6–20)
CO2: 21 mmol/L (ref 20–29)
CREATININE: 0.7 mg/dL (ref 0.57–1.00)
Calcium: 9.4 mg/dL (ref 8.7–10.2)
Chloride: 102 mmol/L (ref 96–106)
GFR calc non Af Amer: 122 mL/min/{1.73_m2} (ref >59)
GFR, EST AFRICAN AMERICAN: 140 mL/min/{1.73_m2} (ref >59)
Glucose: 91 mg/dL (ref 65–99)
Potassium: 4.5 mmol/L (ref 3.5–5.2)
SODIUM: 140 mmol/L (ref 134–144)

## 2018-04-09 LAB — CBC WITH DIFFERENTIAL/PLATELET
Basophils Absolute: 0 10*3/uL (ref 0.0–0.2)
Basos: 0 %
EOS (ABSOLUTE): 0.1 10*3/uL (ref 0.0–0.4)
EOS: 2 %
HEMATOCRIT: 36.5 % (ref 34.0–46.6)
HEMOGLOBIN: 11.7 g/dL (ref 11.1–15.9)
Immature Grans (Abs): 0 10*3/uL (ref 0.0–0.1)
Immature Granulocytes: 0 %
LYMPHS ABS: 1.9 10*3/uL (ref 0.7–3.1)
Lymphs: 33 %
MCH: 26.2 pg — AB (ref 26.6–33.0)
MCHC: 32.1 g/dL (ref 31.5–35.7)
MCV: 82 fL (ref 79–97)
MONOCYTES: 8 %
Monocytes Absolute: 0.5 10*3/uL (ref 0.1–0.9)
NEUTROS ABS: 3.3 10*3/uL (ref 1.4–7.0)
Neutrophils: 57 %
Platelets: 377 10*3/uL (ref 150–450)
RBC: 4.46 x10E6/uL (ref 3.77–5.28)
RDW: 13.4 % (ref 12.3–15.4)
WBC: 5.8 10*3/uL (ref 3.4–10.8)

## 2019-01-26 ENCOUNTER — Other Ambulatory Visit: Payer: Self-pay | Admitting: Family Medicine

## 2019-01-26 DIAGNOSIS — Z3041 Encounter for surveillance of contraceptive pills: Secondary | ICD-10-CM

## 2019-01-26 NOTE — Telephone Encounter (Signed)
Patient had negative pap done 04/09/2017. She has an OV scheduled for 04/20/2019.

## 2019-01-27 ENCOUNTER — Other Ambulatory Visit: Payer: Self-pay | Admitting: Family Medicine

## 2019-01-27 DIAGNOSIS — Z3041 Encounter for surveillance of contraceptive pills: Secondary | ICD-10-CM

## 2019-01-28 ENCOUNTER — Other Ambulatory Visit: Payer: Self-pay | Admitting: Family Medicine

## 2019-01-28 DIAGNOSIS — Z3041 Encounter for surveillance of contraceptive pills: Secondary | ICD-10-CM

## 2019-02-08 ENCOUNTER — Telehealth: Payer: Self-pay | Admitting: Medical

## 2019-02-08 ENCOUNTER — Other Ambulatory Visit: Payer: Self-pay

## 2019-02-08 ENCOUNTER — Encounter: Payer: Self-pay | Admitting: Medical

## 2019-02-08 ENCOUNTER — Ambulatory Visit: Payer: PRIVATE HEALTH INSURANCE | Admitting: Medical

## 2019-02-08 VITALS — BP 122/80 | Ht 63.0 in | Wt 165.0 lb

## 2019-02-08 DIAGNOSIS — R21 Rash and other nonspecific skin eruption: Secondary | ICD-10-CM | POA: Diagnosis not present

## 2019-02-08 DIAGNOSIS — L255 Unspecified contact dermatitis due to plants, except food: Secondary | ICD-10-CM | POA: Diagnosis not present

## 2019-02-08 MED ORDER — TRIAMCINOLONE ACETONIDE 0.1 % EX CREA: 1.0000 "application " | TOPICAL_CREAM | Freq: Two times a day (BID) | CUTANEOUS | 0 refills | Status: DC

## 2019-02-08 MED ORDER — PREDNISONE 10 MG PO TABS: 10.0000 mg | ORAL_TABLET | Freq: Every day | ORAL | 0 refills | Status: DC

## 2019-02-08 NOTE — Telephone Encounter (Signed)
Received call from [facility] re prednisone rx , rx reads 10 mg take 1 tablet daily but also shows 6/5/4/3/2/1 taper. They Need clarification if 10 mg daily or dose pack Per Audelia Acton rx is for dose pak

## 2019-02-08 NOTE — Progress Notes (Signed)
Subjective:    Hannah Brown is a 28 y.o. female  This visit type was conducted due to national recommendations for restrictions regarding the COVID-19 Pandemic (e.g. social distancing) in an effort to limit this patient's exposure and mitigate transmission in our community.   This format is felt to be most appropriate for this patient at this time.    Documentation for virtual audio and video telecommunications through Zoom encounter:  The patient was located at home. The provider was located in the office. The patient did consent to this visit and is aware of possible charges through their insurance for this visit.  The other persons participating in this telemedicine service were none. Time spent on call was 15 minutes and in review of previous records >15 minutes total.  This virtual service is not related to other E/M service within previous 7 days.  She presents for evaluation of a rash involving the bilat forearms, left foot, right neck.  Concerned for poison ivy as they have recent exposure - pulling weeds 1.5 weeks ago.   Rash started 1 week ago.  Rash is itchy, red, raised.  Patient denies: fever, joint aches, chills, nausea. Patient has not had contacts with similar rash.   Used ringworm cream initially for the rash, then switched to hydrocortisone which helped a little.  No other aggravating or relieving factors.  No other c/o.   The following portions of the patient's history were reviewed and updated as appropriate: allergies, current medications, past family history, past medical history, past social history and problem list.  Review of Systems As in subjective above   Objective:  BP 122/80   Ht 5\' 3"  (1.6 m)   Wt 165 lb (74.8 kg)   LMP 01/25/2019 (Exact Date)   BMI 29.23 kg/m   Not examined in person Gen: wd, wn, nad Skin: Left forearm posteriorly distal and proximal with some linear urticarial rash as well as patches of splotchy round urticarial rash, there are  also some   2- 3 mm pink-red lesions that appear to be maculopapular on the posterior forearm left as well, there are some slightly raised urticaria rash on right neck and left dorsal foot, all suggested a poison ivy dermatitis  Assessment:   Encounter Diagnoses  Name Primary?  . Plant dermatitis Yes  . Rash       Plan:   Discussed symptoms and exam findings, diagnosis, treatment options.  Etiology appears to be poison ivy dermatitis.  Prescribed: TAC steroid cream to apply topically daily (except face) for the next several days until this resolves.  Can use the Hydrocortisone cream OTC for face if needed.  Can use Benadryl oral for itching as she is already doing.  Begin prednisone taper oral, discussed risks/benefits.  Discussed washing contaminated clothing on the hot cycle in the washing machine, washing gloves, shoes, or other utensils in hot soapy water.  Avoid re- exposure.  Discussed signs of infection or worsening symptoms that would prompt recheck.   Follow up prn, or if not much improved in the next 4-5 days.  Billiejo was seen today for rash.  Diagnoses and all orders for this visit:  Plant dermatitis  Rash  Other orders -     predniSONE (DELTASONE) 10 MG tablet; Take 1 tablet (10 mg total) by mouth daily with breakfast. 6/5/4/3/2/1 taper -     triamcinolone cream (KENALOG) 0.1 %; Apply 1 application topically 2 (two) times daily.

## 2019-04-19 NOTE — Progress Notes (Signed)
Chief Complaint  Patient presents with  . Annual Exam    fasting annual exam, no pelvic due to having cycle today that started yesterday. No concerns.     Hannah Brown is a 28 y.o. female who presents for a complete physical.  She has the following concerns:  Skin concern--had 2 areas pop up on the left breast about 2 months ago--was an ingrown hair.  It had been tender, she saw the hair. These resolved, but scarred some (no longer sore, but looks darker).  She is also complaining of fishy odor after intercourse, for the last few months.  She has a slight discharge, clear to white, no odor (except after intercourse), unchanged (her "usual" discharge).  Denies pelvic pain or pain with intercourse. She has h/o BV when in college, smells similar (but was much worse then).  Recalls tolerating flagyl, but got a yeast infection from it.  Under the care of dermatologist in [place] for acne.  On Spironolactone, Epiduo.  Last visit with derm at [facility] was 05/2018, at which time the plan was to start Accutane--to return in a month.  She has not been back since, didn't start med.  At her last visit with me (2 months prior to that derm f/u), she had reported being very pleased with current treatment. She still randomly gets a lot of cystic acne.  Thinks she will start accutane, and has f/u scheduled (they had to r/s, got busy, COVID, etc, so hadn't started yet). She denies SE from meds.  Labs have not been monitored at derm.  Contraceptive management: she is using OCP's. She is in a monogamous relationship, married.    Immunization History  Administered Date(s) Administered  . DTaP 07/31/1993, 11/06/1993, 01/24/1994, 11/06/1994, 02/23/1998  . HPV Quadrivalent 01/07/2012, 03/01/2012, 01/17/2013  . Hepatitis A 10/10/2010, 01/07/2012  . Hepatitis B 12/10/92, 07/31/1993, 05/02/1994  . IPV 07/31/1993, 11/06/1993, 05/02/1994, 02/23/1998  . Influenza Split 06/27/2013, 06/10/2014  . MMR 11/06/1994,  02/23/1998  . Meningococcal Conjugate 10/10/2010  . Td 09/20/2010  . Tdap 10/11/2011   Gets flu shots at work (works for [employer]) Last Pap smear: 03/2017, normal (also normal 2017, 2015) Last mammogram: never  Last colonoscopy: never  Last DEXA: never  Dentist: twice yearly  Ophtho: 3 years ago, wears contacts/glasses. Exercise:walks 3 miles 2x/week (on days off, takes 60-90 mins, takes 1 dog); occasional hiking. Lipids: Lab Results  Component Value Date   CHOL 208 (H) 12/24/2015   HDL 71 12/24/2015   LDLCALC 111 12/24/2015   TRIG 129 12/24/2015   CHOLHDL 2.9 12/24/2015   Vitamin D-OH screen 01/2015 normal at 34  Vegan diet.  Past Medical History:  Diagnosis Date  . Acne   . H/O varicella     Past Surgical History:  Procedure Laterality Date  . BREAST ENHANCEMENT SURGERY  56/3893   silicone implants    Social History   Socioeconomic History  . Marital status: Single    Spouse name: Not on file  . Number of children: Not on file  . Years of education: Not on file  . Highest education level: Not on file  Occupational History  . Occupation: Programmer, multimedia: [employer]  . Financial resource strain: Not on file  . Food insecurity    Worry: Not on file    Inability: Not on file  . Transportation needs    Medical: Not on file    Non-medical: Not on file  Tobacco Use  .  Smoking status: Never Smoker  . Smokeless tobacco: Never Used  Substance and Sexual Activity  . Alcohol use: Yes    Alcohol/week: 0.0 standard drinks    Comment: 3 glasses of wine/week  . Drug use: No  . Sexual activity: Yes    Partners: Male    Birth control/protection: Pill  Lifestyle  . Physical activity    Days per week: Not on file    Minutes per session: Not on file  . Stress: Not on file  Relationships  . Social Herbalist on phone: Not on file    Gets together: Not on file    Attends religious service: Not on file    Active member of club  or organization: Not on file    Attends meetings of clubs or organizations: Not on file    Relationship status: Not on file  . Intimate partner violence    Fear of current or ex partner: Not on file    Emotionally abused: Not on file    Physically abused: Not on file    Forced sexual activity: Not on file  Other Topics Concern  . 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 Lives with husband, 3 dogs (mini-schnauzers--the 2 original had a puppy).   Vegan diet    Family History  Problem Relation Age of Onset  . Hyperlipidemia Father   . Diabetes Maternal Grandmother   . Kidney disease Maternal Grandmother   . Diabetes Paternal Grandmother   . Cancer Paternal Aunt        thyroid cancer  . Diabetes Paternal Aunt     Outpatient Encounter Medications as of 04/20/2019  Medication Sig Note  . EPIDUO 0.1-2.5 % gel APPLY TO FACE AT BEDTIME   . Melatonin 5 MG TABS Take 1 tablet by mouth as needed. 04/20/2019: Occasionally takes at night  . Multiple Vitamins-Minerals (MULTIVITAMIN WITH MINERALS) tablet Take 1 tablet by mouth daily.   Marland Kitchen spironolactone (ALDACTONE) 50 MG tablet Take 50 mg by mouth daily.   . TRI-LEGEST FE 1-20/1-30/1-35 MG-MCG tablet TAKE 1 TABLET BY MOUTH DAILY.   . diphenhydrAMINE (BENADRYL) 25 MG tablet Take 25 mg by mouth every 6 (six) hours as needed.   . loratadine (CLARITIN) 10 MG tablet Take 10 mg by mouth daily. 04/20/2019: Uses in the Spring  . [DISCONTINUED] predniSONE (DELTASONE) 10 MG tablet Take 1 tablet (10 mg total) by mouth daily with breakfast. 6/5/4/3/2/1 taper   . [DISCONTINUED] triamcinolone cream (KENALOG) 0.1 % Apply 1 application topically 2 (two) times daily.    No facility-administered encounter medications on file as of 04/20/2019.     Allergies  Allergen Reactions  . Minocycline Hives and Other (See Comments)    Itchy throat    ROS: The patient  denies anorexia, fever, headaches, vision changes, weight changes, decreased hearing, ear pain, sore throat, breast concerns, chest pain, palpitations, dizziness, syncope, dyspnea on exertion, cough, swelling, nausea, vomiting, diarrhea, constipation, abdominal pain, melena, hematochezia, indigestion/heartburn, hematuria, incontinence, dysuria, irregular menstrual cycles (on OCP's, very light, lasting 3d), genital lesions, vaginal discharge, joint pains, numbness, tingling, weakness, tremor, suspicious skin lesions, abnormal bleeding/bruising, or enlarged lymph nodes. +acne--per HPI, controlled on current regimen, but intermittent flares with cystic acne. Seasonal allergies--controlled with OTC meds when needed, no symptoms now. Some motion sickness when her husband drives. Occasional heartburn  White vaginal discharge, fluctuates throughout cycle, no  odor/itch or pelvic pain.  Only recently noted odor after intercourse, per HPI. She has been checking BP at home bc it tends to be high when at the doctor.  118-126/78-90 (mostly 120/80); once was 140/100 as urgent care when she had strep. 10# weight gain in the last year.  Cooking and eating more since she is home more. Some juices.  No sweet tea or soda.  PHYSICAL EXAM:  BP 130/88   Pulse 92   Temp (!) 97.3 F (36.3 C) (Other (Comment))   Ht _0  (1.626 m)   Wt 166 lb (75.3 kg)   LMP 04/19/2019 (Exact Date)   BMI 28.49 kg/m    124/82 on repeat by MD  Wt Readings from Last 3 Encounters:  04/20/19 166 lb (75.3 kg)  02/08/19 165 lb (74.8 kg)  04/09/18 156 lb 9.6 oz (71 kg)    General Appearance:  Alert, cooperative, no distress, appears stated age   Head:  Normocephalic, without obvious abnormality, atraumatic   Eyes:  PERRL, conjunctiva/corneas clear, EOM's intact, fundi benign   Ears:  Normal TM's and external ear canals   Nose:  Not examined (wearing mask due to COVID-19 pandemic)   Throat:  Not examined  (wearing mask due to COVID-19 pandemic)  Neck:  Supple, no lymphadenopathy; thyroid: no enlargement/  tenderness/nodules; no carotid bruit or JVD   Back:  Spine nontender, no curvature, ROM normal, no CVA tenderness   Lungs:  Clear to auscultation bilaterally without wheezes, rales or ronchi; respirations unlabored   Chest Wall:  No tenderness or deformity   Heart:  Regular rate and rhythm, S1 and S2 normal, no murmur, rub or gallop   Breast Exam:  No tenderness, masses, or nipple discharge or inversion. R nipple is pierced.Bilateral implants present, soft, nontender.  WHSS bilateral inferior breasts. No axillary lymphadenopathy. two areas of hyperpigmentation at left upper lateral and lower lateral breast--just hyperpigmented. No induration, warmth, or tenderness   Abdomen:  Soft, non-tender, nondistended, normoactive bowel sounds,  no masses, no hepatosplenomegaly. Umbilical piercing  Genitalia:  Exam declined due to heavy menstrual cycle  Rectal:  Not performed due to age<40 and no related complaints   Extremities:  No clubbing, cyanosis or edema   Pulses:  2+ and symmetric all extremities   Skin:  Skin color, texture, turgor normal, no rashes or lesions. skin on back is clear. Face not examined under mask. Visualized portions appear clear of acne.  Lymph nodes:  Cervical, supraclavicular, and axillary nodes normal   Neurologic:  Normal strength, sensation and gait; reflexes 2+ and symmetric throughout    Psych: Normal mood, affect, hygiene and grooming    ASSESSMENT/PLAN:  Annual physical exam - Plan: CBC with Differential/Platelet, Comprehensive metabolic panel, Lipid panel  Encounter for surveillance of contraceptive pills - discussed importance of OCP's PLUS condoms if going to start accutane - Plan: Comprehensive metabolic panel, TRI-LEGEST FE 1-20/1-30/1-35 MG-MCG tablet  Acne vulgaris - plans to start  accutane due to some persistence/recurrences of cystic acne on current regimen  BV (bacterial vaginosis) - suspected based on history. Unable to do exam due to heavy menstrual cycle. treat presumptively - Plan: metroNIDAZOLE (METROGEL VAGINAL) 0.75 % vaginal gel  Need for influenza vaccination - Plan: Flu Vaccine QUAD 6+ mos PF IM (Fluarix Quad PF)   c-met, lipids (pre-Accutane), CBC  Presumptive treatment of BV, since we cannot evaluate today due to heavy menses.  Discussed monthly self breast exams and yearly mammograms after the age of 23;  at least 30 minutes of aerobic activity at least 5 days/week, weight-bearing exercise at least 2x/wk; proper sunscreen use reviewed; healthy diet, including goals of calcium and vitamin D intake and alcohol recommendations (less than or equal to 1 drink/day) reviewed; regular seatbelt use; changing batteries in smoke detectors. Immunization recommendations discussed, UTD. Continue yearly flu shots (given today, with documentation to show her job).  Pap smear next year  F/u 1 year, sooner prn.

## 2019-04-19 NOTE — Patient Instructions (Addendum)
  HEALTH MAINTENANCE RECOMMENDATIONS:  It is recommended that you get at least 30 minutes of aerobic exercise at least 5 days/week (for weight loss, you may need as much as 60-90 minutes). This can be any activity that gets your heart rate up. This can be divided in 10-15 minute intervals if needed, but try and build up your endurance at least once a week.  Weight bearing exercise is also recommended twice weekly.  Eat a healthy diet with lots of vegetables, fruits and fiber.  "Colorful" foods have a lot of vitamins (ie green vegetables, tomatoes, red peppers, etc).  Limit sweet tea, regular sodas and alcoholic beverages, all of which has a lot of calories and sugar.  Up to 1 alcoholic drink daily may be beneficial for women (unless trying to lose weight, watch sugars).  Drink a lot of water.  Calcium recommendations are 1200-1500 mg daily (1500 mg for postmenopausal women or women without ovaries), and vitamin D 1000 IU daily.  This should be obtained from diet and/or supplements (vitamins), and calcium should not be taken all at once, but in divided doses.  Monthly self breast exams and yearly mammograms for women over the age of 36 is recommended.  Sunscreen of at least SPF 30 should be used on all sun-exposed parts of the skin when outside between the hours of 10 am and 4 pm (not just when at beach or pool, but even with exercise, golf, tennis, and yard work!)  Use a sunscreen that says "broad spectrum" so it covers both UVA and UVB rays, and make sure to reapply every 1-2 hours.  Remember to change the batteries in your smoke detectors when changing your clock times in the spring and fall. Carbon monoxide detectors are recommended for your home.  Use your seat belt every time you are in a car, and please drive safely and not be distracted with cell phones and texting while driving.  Schedule routine eye exam.

## 2019-04-20 ENCOUNTER — Other Ambulatory Visit: Payer: Self-pay

## 2019-04-20 ENCOUNTER — Encounter: Payer: Self-pay | Admitting: Family Medicine

## 2019-04-20 ENCOUNTER — Ambulatory Visit: Payer: PRIVATE HEALTH INSURANCE | Admitting: Family Medicine

## 2019-04-20 VITALS — BP 124/82 | HR 92 | Temp 97.3°F | Ht 64.0 in | Wt 166.0 lb

## 2019-04-20 DIAGNOSIS — Z23 Encounter for immunization: Secondary | ICD-10-CM | POA: Diagnosis not present

## 2019-04-20 DIAGNOSIS — Z3041 Encounter for surveillance of contraceptive pills: Secondary | ICD-10-CM

## 2019-04-20 DIAGNOSIS — Z0000 Encounter for general adult medical examination without abnormal findings: Secondary | ICD-10-CM

## 2019-04-20 DIAGNOSIS — L700 Acne vulgaris: Secondary | ICD-10-CM | POA: Diagnosis not present

## 2019-04-20 DIAGNOSIS — N760 Acute vaginitis: Secondary | ICD-10-CM

## 2019-04-20 DIAGNOSIS — B9689 Other specified bacterial agents as the cause of diseases classified elsewhere: Secondary | ICD-10-CM

## 2019-04-20 MED ORDER — METRONIDAZOLE 0.75 % VA GEL: 1.0000 | Freq: Every day | VAGINAL | 0 refills | Status: DC

## 2019-04-20 MED ORDER — TRI-LEGEST FE 1-20/1-30/1-35 MG-MCG PO TABS: 1.0000 | ORAL_TABLET | Freq: Every day | ORAL | 3 refills | Status: DC

## 2019-04-21 LAB — CBC WITH DIFFERENTIAL/PLATELET
Basophils Absolute: 0 10*3/uL (ref 0.0–0.2)
Basos: 1 %
EOS (ABSOLUTE): 0.1 10*3/uL (ref 0.0–0.4)
Eos: 1 %
Hematocrit: 37.2 % (ref 34.0–46.6)
Hemoglobin: 12 g/dL (ref 11.1–15.9)
Immature Grans (Abs): 0 10*3/uL (ref 0.0–0.1)
Immature Granulocytes: 0 %
Lymphocytes Absolute: 1.8 10*3/uL (ref 0.7–3.1)
Lymphs: 28 %
MCH: 26.3 pg — ABNORMAL LOW (ref 26.6–33.0)
MCHC: 32.3 g/dL (ref 31.5–35.7)
MCV: 82 fL (ref 79–97)
Monocytes Absolute: 0.5 10*3/uL (ref 0.1–0.9)
Monocytes: 8 %
Neutrophils Absolute: 4.1 10*3/uL (ref 1.4–7.0)
Neutrophils: 62 %
Platelets: 385 10*3/uL (ref 150–450)
RBC: 4.56 x10E6/uL (ref 3.77–5.28)
RDW: 12.7 % (ref 11.7–15.4)
WBC: 6.5 10*3/uL (ref 3.4–10.8)

## 2019-04-21 LAB — COMPREHENSIVE METABOLIC PANEL
ALT: 8 IU/L (ref 0–32)
AST: 14 IU/L (ref 0–40)
Albumin/Globulin Ratio: 1.1 — ABNORMAL LOW (ref 1.2–2.2)
Albumin: 4 g/dL (ref 3.9–5.0)
Alkaline Phosphatase: 87 IU/L (ref 39–117)
BUN/Creatinine Ratio: 8 — ABNORMAL LOW (ref 9–23)
BUN: 6 mg/dL (ref 6–20)
Bilirubin Total: <0.2 mg/dL (ref 0.0–1.2)
CO2: 23 mmol/L (ref 20–29)
Calcium: 9.7 mg/dL (ref 8.7–10.2)
Chloride: 101 mmol/L (ref 96–106)
Creatinine, Ser: 0.75 mg/dL (ref 0.57–1.00)
GFR calc Af Amer: 128 mL/min/{1.73_m2} (ref >59)
GFR calc non Af Amer: 111 mL/min/{1.73_m2} (ref >59)
Globulin, Total: 3.6 g/dL (ref 1.5–4.5)
Glucose: 82 mg/dL (ref 65–99)
Potassium: 4.7 mmol/L (ref 3.5–5.2)
Sodium: 139 mmol/L (ref 134–144)
Total Protein: 7.6 g/dL (ref 6.0–8.5)

## 2019-04-21 LAB — LIPID PANEL
Chol/HDL Ratio: 3.9 ratio (ref 0.0–4.4)
Cholesterol, Total: 228 mg/dL — ABNORMAL HIGH (ref 100–199)
HDL: 59 mg/dL (ref >39)
LDL Chol Calc (NIH): 132 mg/dL — ABNORMAL HIGH (ref 0–99)
Triglycerides: 210 mg/dL — ABNORMAL HIGH (ref 0–149)
VLDL Cholesterol Cal: 37 mg/dL (ref 5–40)

## 2019-04-28 ENCOUNTER — Encounter: Payer: Self-pay | Admitting: Family Medicine

## 2019-04-28 MED ORDER — FLUCONAZOLE 150 MG PO TABS: 150.0000 mg | ORAL_TABLET | Freq: Once | ORAL | 0 refills | Status: AC

## 2019-11-17 HISTORY — PX: DILATION AND CURETTAGE OF UTERUS: SHX78

## 2019-12-15 ENCOUNTER — Encounter: Payer: Self-pay | Admitting: Family Medicine

## 2020-04-29 ENCOUNTER — Encounter: Payer: Self-pay | Admitting: Family Medicine

## 2020-04-29 NOTE — Progress Notes (Signed)
Chief Complaint  Patient presents with  . Annual Exam    fasting annual exam with pap. Lots of new anxiety, had two panic attacks this year.   . Depression Screen    3    Hannah Brown is a 29 y.o. female who presents for a complete physical.    She had a miscarriage in April 2021, had D&C.  They aren't actively trying, but not using contraception currently.  She continues to take prenatal vitamins.  Today she is complaining of anxiety, has been higher overall. She reports she has had two anxiety attacks since March--one related to the miscarriage, second had no particular trigger, just very worried.  That was in July. None since.  See GAD-7 and PHQ-9 screening questionnaires.  BP's are running 120's-130/80's at home (they are often elevated at the doctor).  Under the care of dermatologist in [place] for cystic acne. Last year was treated with Spironolactone, Epiduo. She did end up starting Accutane last Fall, didn't tolerate it, only took for 2 months.  She hasn't had much cystic acne recur, not currently using any treatment. She is having some acne under the mask, asking for topical medication.  Triglycerides were mildly elevated last year.  She eats a vegan diet.  Repeat at [facility] was 155 in 06/2019 (monitored while on Accutane). She gained a lot of weight during her early pregnancy (prior to miscarriage).  She has lost some weight, but still finds herself very hungry, unsure if related to anxiety.  Lab Results  Component Value Date   CHOL 228 (H) 04/20/2019   HDL 59 04/20/2019   LDLCALC 132 (H) 04/20/2019   TRIG 210 (H) 04/20/2019   CHOLHDL 3.9 04/20/2019    Immunization History  Administered Date(s) Administered  . DTaP 07/31/1993, 11/06/1993, 01/24/1994, 11/06/1994, 02/23/1998  . HPV Quadrivalent 01/07/2012, 03/01/2012, 01/17/2013  . Hepatitis A 10/10/2010, 01/07/2012  . Hepatitis B 10/25/1992, 07/31/1993, 05/02/1994  . IPV 07/31/1993, 11/06/1993, 05/02/1994, 02/23/1998  .  Influenza Split 06/27/2013, 06/10/2014  . Influenza,inj,Quad PF,6+ Mos 04/20/2019  . Influenza-Unspecified 06/06/2016, 06/11/2017, 06/10/2018  . MMR 11/06/1994, 02/23/1998  . Meningococcal Conjugate 10/10/2010  . PFIZER SARS-COV-2 Vaccination 09/02/2019, 09/23/2019  . Td 09/20/2010  . Tdap 10/11/2011   Last Pap smear:03/2017, normal (also normal 2017, 2015) Last mammogram: never  Last colonoscopy: never  Last DEXA: never  Dentist: twice yearly  Ophtho:yearly Exercise:Weight-bearing exercise 2x/week, walking 4x/week x 3 miles with the dogs. Vitamin D-OH screen 01/2015 normal at 32 Hg 11.2 in 11/2019 GC and chlamydia screen negative in 11/2019  Vegan diet.   PMH, PSH, SH and FH reviewed and updated She is in school for NP at [employer], working part-time on [employer] floor now  Outpatient Encounter Medications as of 04/30/2020  Medication Sig Note  . diphenhydrAMINE (BENADRYL) 25 MG tablet Take 25 mg by mouth every 6 (six) hours as needed.   . loratadine (CLARITIN) 10 MG tablet Take 10 mg by mouth daily. 04/20/2019: Uses in the Spring  . Multiple Vitamins-Minerals (MULTIVITAMIN WITH MINERALS) tablet Take 1 tablet by mouth daily. 04/30/2020: Prenatal vitamin  . Adapalene-Benzoyl Peroxide (EPIDUO) 0.1-2.5 % gel APPLY TO FACE AT BEDTIME   . Melatonin 5 MG TABS Take 1 tablet by mouth as needed. (Patient not taking: Reported on 04/30/2020) 04/20/2019: Occasionally takes at night  . [DISCONTINUED] EPIDUO 0.1-2.5 % gel APPLY TO FACE AT BEDTIME   . [DISCONTINUED] metroNIDAZOLE (METROGEL VAGINAL) 0.75 % vaginal gel Place 1 Applicatorful vaginally at bedtime. Use for 5 days   . [  DISCONTINUED] spironolactone (ALDACTONE) 50 MG tablet Take 50 mg by mouth daily.   . [DISCONTINUED] TRI-LEGEST FE 1-20/1-30/1-35 MG-MCG tablet Take 1 tablet by mouth daily.    No facility-administered encounter medications on file as of 04/30/2020.   (NOT using epiduo prior to today's visit).  Allergies  Allergen Reactions   . Minocycline Hives and Other (See Comments)    Itchy throat    ROS: The patient denies anorexia, fever, headaches, vision changes, weight changes, decreased hearing, ear pain, sore throat, breast concerns, chest pain, palpitations, dizziness, syncope, dyspnea on exertion, cough, swelling, nausea, vomiting, diarrhea, constipation, abdominal pain, melena, hematochezia, indigestion/heartburn, hematuria, incontinence, dysuria, irregular menstrual cycles, genital lesions, vaginal discharge, joint pains, numbness, tingling, weakness, tremor, suspicious skin lesions, abnormal bleeding/bruising, or enlarged lymph nodes. Seasonal allergies--controlled with OTC meds prn. Acne per HPI Had gained up to 185# when [redacted] weeks pregnant, has lost 10# Depression/anxiety per HPI   PHYSICAL EXAM:  BP 140/90   Pulse 100   Ht _0  (1.626 m)   Wt 176 lb (79.8 kg)   LMP 04/09/2020 (Exact Date)   BMI 30.21 kg/m   124/82 on repeat by MD  Wt Readings from Last 3 Encounters:  04/30/20 176 lb (79.8 kg)  04/20/19 166 lb (75.3 kg)  02/08/19 165 lb (74.8 kg)    General Appearance:  Alert, cooperative, no distress, appears stated age   Head:  Normocephalic, without obvious abnormality, atraumatic   Eyes:  PERRL, conjunctiva/corneas clear, EOM's intact, fundi benign   Ears:  Normal TM's and external ear canals   Nose:  Not examined (wearing mask due to COVID-19 pandemic)   Throat:  Not examined (wearing mask due to COVID-19 pandemic)  Neck:  Supple, no lymphadenopathy; thyroid: no enlargement/  tenderness/nodules; no carotid bruit or JVD   Back:  Spine nontender, no curvature, ROM normal, no CVA tenderness   Lungs:  Clear to auscultation bilaterally without wheezes, rales or ronchi; respirations unlabored   Chest Wall:  No tenderness or deformity   Heart:  Regular rate and rhythm, S1 and S2 normal, no murmur, rub or gallop   Breast Exam:  No tenderness, masses, or  nipple discharge or inversion. R nipple is pierced.Bilateral implants present, soft, nontender. WHSS bilateral inferior breasts.No axillary lymphadenopathy.   Abdomen:  Soft, non-tender, nondistended, normoactive bowel sounds,  no masses, no hepatosplenomegaly. Umbilical piercing  Genitalia:  Normal external genitalia without lesions.  BUS and vagina normal. Cervix is normal, no lesions, no cervical motion tenderness.  No abnormal discharge.  Uterus and adnexa are not enlarged, nontender.  Pap obtained.  Rectal:  Not performed due to age<40 and no related complaints   Extremities:  No clubbing, cyanosis or edema   Pulses:  2+ and symmetric all extremities   Skin:  Skin color, texture, turgor normal, no rashes or lesions. skin on back is clear. Brief exam of face (mask pulled down) shows no evidence of inflammatory papules, a few small whiteheads and pustules noted on cheek at mask edge.  Lymph nodes:  Cervical, supraclavicular, and axillary nodes normal   Neurologic:  Normal strength, sensation and gait; reflexes 2+ and symmetric throughout    Psych: Normal mood, affect, hygiene and grooming  GAD score of 15 PHQ-9 score of 12   ASSESSMENT/PLAN:  Annual physical exam - Plan: CBC with Differential/Platelet, Lipid panel, Glucose, random, Hepatitis C antibody, Cytology - PAP([place])  Elevated cholesterol with high triglycerides - reviewed lowfat diet - Plan: Lipid panel  Need for  hepatitis C screening test - Plan: Hepatitis C antibody  Acne vulgaris - only tolerated 2 mos of accutane.  Some acne related to mask, requesting topical meds; restart epiduo - Plan: Adapalene-Benzoyl Peroxide (EPIDUO) 0.1-2.5 % gel  Need for influenza vaccination - Plan: Flu Vaccine QUAD 6+ mos PF IM (Fluarix Quad PF)  Anxiety and depression - may partially be related to adjustment reaction from miscarriage, vs other stressors. Encouraged counseling and f/u  for meds if not improving  Reviewed some relaxation techniques (4 count breathing), and discussed how counseling can help her--to reach out to [employer] through job.  If not improving, rec f/u visit (can be video) to discuss medications.  She is asking for COVID booster--discussed recommendations (8 mos from 2nd dose), and that we didn't have any available in the office today. She will get through her job.   Discussed monthly self breast exams and yearly mammograms after the age of 23; at least 30 minutes of aerobic activity at least 5 days/week, weight-bearing exercise at least 2x/wk; proper sunscreen use reviewed; healthy diet, including goals of calcium and vitamin D intake and alcohol recommendations (less than or equal to 1 drink/day) reviewed; regular seatbelt use; changing batteries in smoke detectors. Immunization recommendations discussed, UTD. Continue yearly flu shots (given today, with documentation to show her job). COVID booster recommendations reviewed.  F/u 1 year, sooner prn for further treatment of depression/anxiety, if needed

## 2020-04-29 NOTE — Patient Instructions (Addendum)
HEALTH MAINTENANCE RECOMMENDATIONS:  It is recommended that you get at least 30 minutes of aerobic exercise at least 5 days/week (for weight loss, you may need as much as 60-90 minutes). This can be any activity that gets your heart rate up. This can be divided in 10-15 minute intervals if needed, but try and build up your endurance at least once a week.  Weight bearing exercise is also recommended twice weekly.  Eat a healthy diet with lots of vegetables, fruits and fiber.  "Colorful" foods have a lot of vitamins (ie green vegetables, tomatoes, red peppers, etc).  Limit sweet tea, regular sodas and alcoholic beverages, all of which has a lot of calories and sugar.  Up to 1 alcoholic drink daily may be beneficial for women (unless trying to lose weight, watch sugars).  Drink a lot of water.  Calcium recommendations are 1200-1500 mg daily (1500 mg for postmenopausal women or women without ovaries), and vitamin D 1000 IU daily.  This should be obtained from diet and/or supplements (vitamins), and calcium should not be taken all at once, but in divided doses.  Monthly self breast exams and yearly mammograms for women over the age of 81 is recommended.  Sunscreen of at least SPF 30 should be used on all sun-exposed parts of the skin when outside between the hours of 10 am and 4 pm (not just when at beach or pool, but even with exercise, golf, tennis, and yard work!)  Use a sunscreen that says "broad spectrum" so it covers both UVA and UVB rays, and make sure to reapply every 1-2 hours.  Remember to change the batteries in your smoke detectors when changing your clock times in the spring and fall. Carbon monoxide detectors are recommended for your home.  Use your seat belt every time you are in a car, and please drive safely and not be distracted with cell phones and texting while driving.  Reach out to your employee assistance program regarding counseling. Return for another visit (can be video) to  discuss medications if anxiety is not improving or worsening.   Mindfulness-Based Stress Reduction Mindfulness-based stress reduction (MBSR) is a program that helps people learn to practice mindfulness. Mindfulness is the practice of intentionally paying attention to the present moment. It can be learned and practiced through techniques such as education, breathing exercises, meditation, and yoga. MBSR includes several mindfulness techniques in one program. MBSR works best when you understand the treatment, are willing to try new things, and can commit to spending time practicing what you learn. MBSR training may include learning about:  How your emotions, thoughts, and reactions affect your body.  New ways to respond to things that cause negative thoughts to start (triggers).  How to notice your thoughts and let go of them.  Practicing awareness of everyday things that you normally do without thinking.  The techniques and goals of different types of meditation. What are the benefits of MBSR? MBSR can have many benefits, which include helping you to:  Develop self-awareness. This refers to knowing and understanding yourself.  Learn skills and attitudes that help you to participate in your own health care.  Learn new ways to care for yourself.  Be more accepting about how things are, and let things go.  Be less judgmental and approach things with an open mind.  Be patient with yourself and trust yourself more. MBSR has also been shown to:  Reduce negative emotions, such as depression and anxiety.  Improve memory and  focus.  Change how you sense and approach pain.  Boost your body's ability to fight infections.  Help you connect better with other people.  Improve your sense of well-being. Follow these instructions at home:   Find a local in-person or online MBSR program.  Set aside some time regularly for mindfulness practice.  Find a mindfulness practice that works  best for you. This may include one or more of the following: ? Meditation. Meditation involves focusing your mind on a certain thought or activity. ? Breathing awareness exercises. These help you to stay present by focusing on your breath. ? Body scan. For this practice, you lie down and pay attention to each part of your body from head to toe. You can identify tension and soreness and intentionally relax parts of your body. ? Yoga. Yoga involves stretching and breathing, and it can improve your ability to move and be flexible. It can also provide an experience of testing your body's limits, which can help you release stress. ? Mindful eating. This way of eating involves focusing on the taste, texture, color, and smell of each bite of food. Because this slows down eating and helps you feel full sooner, it can be an important part of a weight-loss plan.  Find a podcast or recording that provides guidance for breathing awareness, body scan, or meditation exercises. You can listen to these any time when you have a free moment to rest without distractions.  Follow your treatment plan as told by your health care provider. This may include taking regular medicines and making changes to your diet or lifestyle as recommended. How to practice mindfulness To do a basic awareness exercise:  Find a comfortable place to sit.  Pay attention to the present moment. Observe your thoughts, feelings, and surroundings just as they are.  Avoid placing judgment on yourself, your feelings, or your surroundings. Make note of any judgment that comes up, and let it go.  Your mind may wander, and that is okay. Make note of when your thoughts drift, and return your attention to the present moment. To do basic mindfulness meditation:  Find a comfortable place to sit. This may include a stable chair or a firm floor cushion. ? Sit upright with your back straight. Let your arms fall next to your side with your hands resting  on your legs. ? If sitting in a chair, rest your feet flat on the floor. ? If sitting on a cushion, cross your legs in front of you.  Keep your head in a neutral position with your chin dropped slightly. Relax your jaw and rest the tip of your tongue on the roof of your mouth. Drop your gaze to the floor. You can close your eyes if you like.  Breathe normally and pay attention to your breath. Feel the air moving in and out of your nose. Feel your belly expanding and relaxing with each breath.  Your mind may wander, and that is okay. Make note of when your thoughts drift, and return your attention to your breath.  Avoid placing judgment on yourself, your feelings, or your surroundings. Make note of any judgment or feelings that come up, let them go, and bring your attention back to your breath.  When you are ready, lift your gaze or open your eyes. Pay attention to how your body feels after the meditation. Where to find more information You can find more information about MBSR from:  Your health care provider.  Community-based meditation centers  or programs.  Programs offered near you. Summary  Mindfulness-based stress reduction (MBSR) is a program that teaches you how to intentionally pay attention to the present moment. It is used with other treatments to help you cope better with daily stress, emotions, and pain.  MBSR focuses on developing self-awareness, which allows you to respond to life stress without judgment or negative emotions.  MBSR programs may involve learning different mindfulness practices, such as breathing exercises, meditation, yoga, body scan, or mindful eating. Find a mindfulness practice that works best for you, and set aside time for it on a regular basis. This information is not intended to replace advice given to you by your health care provider. Make sure you discuss any questions you have with your health care provider. Document Revised: 07/17/2017 Document  Reviewed: 12/11/2016 Elsevier Patient Education  2020 [employer].

## 2020-04-30 ENCOUNTER — Ambulatory Visit (INDEPENDENT_AMBULATORY_CARE_PROVIDER_SITE_OTHER): Payer: Managed Care, Other (non HMO) | Admitting: Family Medicine

## 2020-04-30 ENCOUNTER — Other Ambulatory Visit: Payer: Self-pay

## 2020-04-30 ENCOUNTER — Other Ambulatory Visit (HOSPITAL_COMMUNITY)
Admission: RE | Admit: 2020-04-30 | Discharge: 2020-04-30 | Disposition: A | Discharge status: Home / Self Care | Payer: Managed Care, Other (non HMO) | Source: Ambulatory Visit | Attending: Family Medicine | Admitting: Family Medicine

## 2020-04-30 ENCOUNTER — Encounter: Payer: Self-pay | Admitting: Family Medicine

## 2020-04-30 VITALS — BP 124/82 | HR 100 | Ht 64.0 in | Wt 176.0 lb

## 2020-04-30 DIAGNOSIS — Z1159 Encounter for screening for other viral diseases: Secondary | ICD-10-CM | POA: Diagnosis not present

## 2020-04-30 DIAGNOSIS — F419 Anxiety disorder, unspecified: Secondary | ICD-10-CM

## 2020-04-30 DIAGNOSIS — Z0000 Encounter for general adult medical examination without abnormal findings: Secondary | ICD-10-CM

## 2020-04-30 DIAGNOSIS — E782 Mixed hyperlipidemia: Secondary | ICD-10-CM | POA: Diagnosis not present

## 2020-04-30 DIAGNOSIS — L700 Acne vulgaris: Secondary | ICD-10-CM | POA: Diagnosis not present

## 2020-04-30 DIAGNOSIS — Z23 Encounter for immunization: Secondary | ICD-10-CM

## 2020-04-30 DIAGNOSIS — F329 Major depressive disorder, single episode, unspecified: Secondary | ICD-10-CM | POA: Diagnosis not present

## 2020-04-30 MED ORDER — ADAPALENE-BENZOYL PEROXIDE 0.1-2.5 % EX GEL: CUTANEOUS | 1 refills | Status: DC

## 2020-05-01 ENCOUNTER — Ambulatory Visit: Payer: Managed Care, Other (non HMO)

## 2020-05-01 DIAGNOSIS — Z23 Encounter for immunization: Secondary | ICD-10-CM

## 2020-05-01 LAB — LIPID PANEL
Chol/HDL Ratio: 3.7 ratio (ref 0.0–4.4)
Cholesterol, Total: 213 mg/dL — ABNORMAL HIGH (ref 100–199)
HDL: 57 mg/dL (ref >39)
LDL Chol Calc (NIH): 141 mg/dL — ABNORMAL HIGH (ref 0–99)
Triglycerides: 85 mg/dL (ref 0–149)
VLDL Cholesterol Cal: 15 mg/dL (ref 5–40)

## 2020-05-01 LAB — CBC WITH DIFFERENTIAL/PLATELET
Basophils Absolute: 0 10*3/uL (ref 0.0–0.2)
Basos: 0 %
EOS (ABSOLUTE): 0 10*3/uL (ref 0.0–0.4)
Eos: 1 %
Hematocrit: 33.4 % — ABNORMAL LOW (ref 34.0–46.6)
Hemoglobin: 11.1 g/dL (ref 11.1–15.9)
Immature Grans (Abs): 0 10*3/uL (ref 0.0–0.1)
Immature Granulocytes: 0 %
Lymphocytes Absolute: 2.1 10*3/uL (ref 0.7–3.1)
Lymphs: 25 %
MCH: 26.6 pg (ref 26.6–33.0)
MCHC: 33.2 g/dL (ref 31.5–35.7)
MCV: 80 fL (ref 79–97)
Monocytes Absolute: 0.5 10*3/uL (ref 0.1–0.9)
Monocytes: 6 %
Neutrophils Absolute: 5.7 10*3/uL (ref 1.4–7.0)
Neutrophils: 68 %
Platelets: 331 10*3/uL (ref 150–450)
RBC: 4.17 x10E6/uL (ref 3.77–5.28)
RDW: 13.7 % (ref 11.7–15.4)
WBC: 8.4 10*3/uL (ref 3.4–10.8)

## 2020-05-01 LAB — GLUCOSE, RANDOM: Glucose: 90 mg/dL (ref 65–99)

## 2020-05-01 LAB — HEPATITIS C ANTIBODY: Hep C Virus Ab: <0.1 s/co ratio (ref 0.0–0.9)

## 2020-05-02 LAB — CYTOLOGY - PAP: Diagnosis: NEGATIVE

## 2020-05-04 ENCOUNTER — Telehealth: Payer: Self-pay | Admitting: Family Medicine

## 2020-05-04 NOTE — Telephone Encounter (Signed)
P.A. ADAPALENE-BENZOYL PEROXIDE GEL

## 2020-05-09 NOTE — Telephone Encounter (Signed)
P.A. denied states pt needs trial and failure of generic adapalene 0.1% gel over the counter.  Do you want to switch?

## 2020-05-09 NOTE — Telephone Encounter (Signed)
She was trying to save money.  Please let he know that it isn't covered rx, and to continue OTC

## 2020-05-11 NOTE — Telephone Encounter (Signed)
Tried calling pt and mail box full

## 2020-05-11 NOTE — Telephone Encounter (Signed)
Pt called back & I informed her, she will try OTC adapalene and call back report in 2 weeks after .

## 2020-06-26 NOTE — Telephone Encounter (Signed)
Pt using OTC, never called back with any issues

## 2020-08-03 DIAGNOSIS — Z8659 Personal history of other mental and behavioral disorders: Secondary | ICD-10-CM | POA: Insufficient documentation

## 2020-11-19 DIAGNOSIS — K219 Gastro-esophageal reflux disease without esophagitis: Secondary | ICD-10-CM | POA: Insufficient documentation

## 2021-02-11 DIAGNOSIS — Z98891 History of uterine scar from previous surgery: Secondary | ICD-10-CM | POA: Insufficient documentation

## 2021-02-14 DIAGNOSIS — O9081 Anemia of the puerperium: Secondary | ICD-10-CM | POA: Insufficient documentation

## 2021-03-18 DIAGNOSIS — U071 COVID-19: Secondary | ICD-10-CM

## 2021-03-18 HISTORY — DX: COVID-19: U07.1

## 2021-05-05 NOTE — Progress Notes (Signed)
Chief Complaint  Patient presents with   Annual Exam    Fasting annual exam, would like to continue doing gyn exams here is possible. No new concerns. Had covid 1 month ago so will get covid booster later.     Hannah Brown is a 30 y.o. female who presents for a complete physical.  She had a daughter 01/13/21 via C-section for breech.  Pregnancy was complicated by anemia and GERD.  Postpartum Hgb was 8.2, was up to 11.2 on postpartum check in late June (notes reviewed through [facility]).   GERD has resolved.  Anxiety:  She was started on prozac about 3 months ago by a psychiatrist for anxiety.  She had started having anxiety attacks more frequently. This is working very well, no further panic attacks. Denies anxiety or side effects.   H/o cystic acne.  Through [facility] derm was treated in the past with Spironolactone, Epiduo. She couldn't tolerate Accutane , only took for 2 months.  She hasn't had much cystic acne recur, last year reported acne under the mask, and Epiduo was restarted. She stopped it during pregnancy.  Is asking to try Retin-A instead.  Acne is around her nose/cheeks, never gets the cystic lesions anymore.  Mild hyperlipidemia:  LDL was up to 141 in 2021, had TG of 210 in 2020, normal in 2021.  Prior LDLs were 132 in 2020, and 111 in 2017. She continues to follow a vegan diet.  Lab Results  Component Value Date   CHOL 213 (H) 04/30/2020   HDL 57 04/30/2020   LDLCALC 141 (H) 04/30/2020   TRIG 85 04/30/2020   CHOLHDL 3.7 04/30/2020    Immunization History  Administered Date(s) Administered   DTaP 07/31/1993, 11/06/1993, 01/24/1994, 11/06/1994, 02/23/1998   HPV Quadrivalent 01/07/2012, 03/01/2012, 01/17/2013   Hepatitis A 10/10/2010, 01/07/2012   Hepatitis B 06-16-1993, 07/31/1993, 05/02/1994   IPV 07/31/1993, 11/06/1993, 05/02/1994, 02/23/1998   Influenza Split 06/27/2013, 06/10/2014   Influenza,inj,Quad PF,6+ Mos 04/20/2019, 04/30/2020, 05/06/2021    Influenza-Unspecified 06/06/2016, 06/11/2017, 06/10/2018   MMR 11/06/1994, 02/23/1998   Meningococcal Conjugate 10/10/2010   PFIZER(Purple Top)SARS-COV-2 Vaccination 09/02/2019, 09/23/2019, 05/01/2020   Td 09/20/2010   Tdap 10/11/2011, 11/19/2020   Last Pap smear: 04/2020, normal Contraception: on OCP's from GYN.  She had been considering IUD, but got scared of the discomfort related to insertion. Last mammogram: never   Last colonoscopy: never   Last DEXA: never   Dentist: twice yearly   Ophtho:yearly Exercise: Walks 3 miles 4 days/week (with stroller, takes 1 hour). Does some YouTube works out with [employer].  +lifting 11-12# baby. Vitamin D-OH screen 01/2015 normal at 32 Hg 11.2 at post-partum f/u 01/2021  Vegan diet.   PMH, PSH, SH and FH reviewed and updated  Outpatient Encounter Medications as of 05/06/2021  Medication Sig Note   FLUoxetine (PROZAC) 10 MG capsule Take 10 mg by mouth daily.    norethindrone-ethinyl estradiol-FE (LOESTRIN FE) 1-20 MG-MCG tablet Take 1 tablet by mouth daily.    Adapalene-Benzoyl Peroxide (EPIDUO) 0.1-2.5 % gel APPLY TO FACE AT BEDTIME (Patient not taking: Reported on 05/06/2021)    diphenhydrAMINE (BENADRYL) 25 MG tablet Take 25 mg by mouth every 6 (six) hours as needed. (Patient not taking: Reported on 05/06/2021)    loratadine (CLARITIN) 10 MG tablet Take 10 mg by mouth daily. (Patient not taking: Reported on 05/06/2021) 04/20/2019: Uses in the Spring   Melatonin 5 MG TABS Take 1 tablet by mouth as needed. (Patient not taking: No sig reported)  04/20/2019: Occasionally takes at night   Multiple Vitamins-Minerals (MULTIVITAMIN WITH MINERALS) tablet Take 1 tablet by mouth daily. (Patient not taking: Reported on 05/06/2021) 05/06/2021: Stopped when she stopped breastfeeding   No facility-administered encounter medications on file as of 05/06/2021.   Allergies  Allergen Reactions   Minocycline Hives and Other (See Comments)    Itchy throat    ROS: The  patient denies anorexia, fever, headaches, vision changes, weight changes (only related to pregnancy), decreased hearing, ear pain, sore throat, breast concerns, chest pain, palpitations, dizziness, syncope, dyspnea on exertion, cough, swelling, nausea, vomiting, diarrhea, constipation, abdominal pain, melena, hematochezia, indigestion/heartburn, hematuria, incontinence, dysuria, irregular menstrual cycles, genital lesions, vaginal discharge, joint pains, numbness, tingling, weakness, tremor, suspicious skin lesions, abnormal bleeding/bruising, or enlarged lymph nodes. Seasonal allergies--controlled with OTC meds prn, hasn't started bothering her yet. Acne, mild on cheeks No further panic attacks since on prozac. Denies depression.   PHYSICAL EXAM:  BP 128/84   Pulse 72   Ht 5' 3.5" (1.613 m)   Wt 168 lb (76.2 kg)   LMP 04/24/2021 (Exact Date)   BMI 29.29 kg/m    Wt Readings from Last 3 Encounters:  05/06/21 168 lb (76.2 kg)  04/30/20 176 lb (79.8 kg)  04/20/19 166 lb (75.3 kg)    General Appearance:    Alert, cooperative, no distress, appears stated age     Head:    Normocephalic, without obvious abnormality, atraumatic     Eyes:    PERRL, conjunctiva/corneas clear, EOM's intact, fundi benign   Ears:    Normal TM's and external ear canals     Nose:    Not examined (wearing mask due to COVID-19 pandemic)     Throat:    Not examined (wearing mask due to COVID-19 pandemic)   Neck:    Supple, no lymphadenopathy; thyroid: no enlargement/  tenderness/nodules; no carotid bruit or JVD     Back:    Spine nontender, no curvature, ROM normal, no CVA tenderness   Lungs:    Clear to auscultation bilaterally without wheezes, rales or ronchi; respirations unlabored     Chest Wall:    No tenderness or deformity     Heart:    Regular rate and rhythm, S1 and S2 normal, no murmur, rub or gallop     Breast Exam:    Deferred to GYN  Abdomen:    Soft, non-tender, nondistended, normoactive bowel sounds,    no masses, no hepatosplenomegaly. Umbilical piercing  Genitalia:    Deferred to GYN  Rectal:    Not performed due to age<40 and no related complaints     Extremities:    No clubbing, cyanosis or edema     Pulses:    2+ and symmetric all extremities     Skin:    Skin color, texture, turgor normal, no rashes or lesions.  skin on back is clear. Brief exam of face (mask pulled down) shows no evidence of inflammatory papules, a few small papules and whiteheads noted on cheeks and near nose, at mask edge.  Lymph nodes:    Cervical, supraclavicular, and inguinal nodes normal     Neurologic:    Normal strength, sensation and gait; reflexes 2+ and symmetric throughout                          Psych:   Normal mood, affect, hygiene and grooming   ASSESSMENT/PLAN:  Annual physical exam - Plan: Lipid panel, POCT  Urinalysis DIP (Proadvantage Device)  Need for influenza vaccination - Plan: Flu Vaccine QUAD 6+ mos PF IM (Fluarix Quad PF)  Acne vulgaris - Trial of retin-A. Start at lowest dose, and can increase if poor response.  Risks/SE reviewed - Plan: tretinoin (RETIN-A) 0.025 % cream  Panic disorder - under care of psych and doing very well on low dose prozac  Mild hypercholesterolemia - LDL was higher than prev last year; diet remains vegan. Recheck today.  Discussed monthly self breast exams and yearly mammograms after the age of 6; at least 30 minutes of aerobic activity at least 5 days/week, weight-bearing exercise at least 2x/wk; proper sunscreen use reviewed; healthy diet, including goals of calcium and vitamin D intake and alcohol recommendations (less than or equal to 1 drink/day) reviewed; regular seatbelt use; changing batteries in smoke detectors. Immunization recommendations discussed, UTD. Continue yearly flu shots (given today). Updated COVID booster recommended (wait 2 weeks from flu shot)  F/u 1 year, sooner prn.

## 2021-05-05 NOTE — Patient Instructions (Addendum)
  HEALTH MAINTENANCE RECOMMENDATIONS:  It is recommended that you get at least 30 minutes of aerobic exercise at least 5 days/week (for weight loss, you may need as much as 60-90 minutes). This can be any activity that gets your heart rate up. This can be divided in 10-15 minute intervals if needed, but try and build up your endurance at least once a week.  Weight bearing exercise is also recommended twice weekly.  Eat a healthy diet with lots of vegetables, fruits and fiber.  "Colorful" foods have a lot of vitamins (ie green vegetables, tomatoes, red peppers, etc).  Limit sweet tea, regular sodas and alcoholic beverages, all of which has a lot of calories and sugar.  Up to 1 alcoholic drink daily may be beneficial for women (unless trying to lose weight, watch sugars).  Drink a lot of water.  Calcium recommendations are 1200-1500 mg daily (1500 mg for postmenopausal women or women without ovaries), and vitamin D 1000 IU daily.  This should be obtained from diet and/or supplements (vitamins), and calcium should not be taken all at once, but in divided doses.  Monthly self breast exams and yearly mammograms for women over the age of 83 is recommended.  Sunscreen of at least SPF 30 should be used on all sun-exposed parts of the skin when outside between the hours of 10 am and 4 pm (not just when at beach or pool, but even with exercise, golf, tennis, and yard work!)  Use a sunscreen that says "broad spectrum" so it covers both UVA and UVB rays, and make sure to reapply every 1-2 hours.  Remember to change the batteries in your smoke detectors when changing your clock times in the spring and fall. Carbon monoxide detectors are recommended for your home.  Use your seat belt every time you are in a car, and please drive safely and not be distracted with cell phones and texting while driving.  New COVID booster is recommended.  You should wait 2 weeks from flu shot to get this.  Restart  multivitamin/prenatal vitamin.

## 2021-05-06 ENCOUNTER — Other Ambulatory Visit: Payer: Self-pay

## 2021-05-06 ENCOUNTER — Ambulatory Visit: Payer: No Typology Code available for payment source | Admitting: Family Medicine

## 2021-05-06 ENCOUNTER — Encounter: Payer: Self-pay | Admitting: Family Medicine

## 2021-05-06 VITALS — BP 128/84 | HR 72 | Ht 63.5 in | Wt 168.0 lb

## 2021-05-06 DIAGNOSIS — F410 Panic disorder [episodic paroxysmal anxiety] without agoraphobia: Secondary | ICD-10-CM | POA: Diagnosis not present

## 2021-05-06 DIAGNOSIS — Z0000 Encounter for general adult medical examination without abnormal findings: Secondary | ICD-10-CM | POA: Diagnosis not present

## 2021-05-06 DIAGNOSIS — Z23 Encounter for immunization: Secondary | ICD-10-CM | POA: Diagnosis not present

## 2021-05-06 DIAGNOSIS — E7800 Pure hypercholesterolemia, unspecified: Secondary | ICD-10-CM

## 2021-05-06 DIAGNOSIS — L700 Acne vulgaris: Secondary | ICD-10-CM | POA: Diagnosis not present

## 2021-05-06 LAB — POCT URINALYSIS DIP (PROADVANTAGE DEVICE)
Bilirubin, UA: NEGATIVE
Blood, UA: NEGATIVE
Glucose, UA: NEGATIVE mg/dL
Ketones, POC UA: NEGATIVE mg/dL
Leukocytes, UA: NEGATIVE
Nitrite, UA: NEGATIVE
Protein Ur, POC: NEGATIVE mg/dL
Specific Gravity, Urine: 1.015
Urobilinogen, Ur: NEGATIVE
pH, UA: 6 (ref 5.0–8.0)

## 2021-05-06 MED ORDER — TRETINOIN 0.025 % EX CREA: TOPICAL_CREAM | Freq: Every day | CUTANEOUS | 11 refills | Status: DC

## 2021-05-07 LAB — LIPID PANEL
Chol/HDL Ratio: 3.5 ratio (ref 0.0–4.4)
Cholesterol, Total: 201 mg/dL — ABNORMAL HIGH (ref 100–199)
HDL: 58 mg/dL (ref >39)
LDL Chol Calc (NIH): 112 mg/dL — ABNORMAL HIGH (ref 0–99)
Triglycerides: 176 mg/dL — ABNORMAL HIGH (ref 0–149)
VLDL Cholesterol Cal: 31 mg/dL (ref 5–40)

## 2021-05-16 ENCOUNTER — Telehealth: Payer: Self-pay

## 2021-05-16 NOTE — Telephone Encounter (Signed)
Unable to complete P.A. on Cover my meds, Called [facility] t# [phone] and was able to get approval for year.  Called pharmacy and informed   Left message for pt.  PA# [id number]

## 2021-07-20 NOTE — Progress Notes (Signed)
Chief Complaint  Patient presents with   Hospitalization Follow-up    3 week ER follow up. Feeling better. Finished abx a week ago. Did not receive any culture results by phone by MyChart. She thinks they were not run.    Patient presents for ER follow-up.  She was diagnosed with pyelonephritis at evaluation in ER ([facility]) on 11/13, where she had presented with c/o R flank pain.  She was febrile (T101.1), and had some urinary complaints (urgency/frequency, no dysuria).  Normal CBC, lactic acid. K+ low at 3.2. Urine showed trace blood, otherwise normal. It looks like urine tube was held for culture--don't see any culture result (?not done/ordered?)  CT of abdomen/pelvis: Circumferential bladder wall thickening, right urothelial thickening and enhancement, and a striated right renal nephrogram are concerning for an ascending urinary tract infection and developing pyelonephritis.  She was treated with 1gm ceftriaxone, toradol, fluids and KCl. She was prescribed Cefpodoxime 100mg  BID x 10 days.  No longer has any flank pain. 3 days ago she noticed just slight stinging with urination, which is how everything started last time.  She no longer has any urgency or frequency.   Today isn't having any stinging or any urinary issues.  Not using contraception, but not actively trying for pregnancy.  PMH, PSH, SH reviewed  Outpatient Encounter Medications as of 07/22/2021  Medication Sig Note   FLUoxetine (PROZAC) 10 MG capsule Take 10 mg by mouth daily.    tretinoin (RETIN-A) 0.025 % cream Apply topically at bedtime.    diphenhydrAMINE (BENADRYL) 25 MG tablet Take 25 mg by mouth every 6 (six) hours as needed. (Patient not taking: Reported on 05/06/2021)    loratadine (CLARITIN) 10 MG tablet Take 10 mg by mouth daily. (Patient not taking: Reported on 05/06/2021) 04/20/2019: Uses in the Spring   Melatonin 5 MG TABS Take 1 tablet by mouth as needed. (Patient not taking: Reported on  04/30/2020) 04/20/2019: Occasionally takes at night   Multiple Vitamins-Minerals (MULTIVITAMIN WITH MINERALS) tablet Take 1 tablet by mouth daily. (Patient not taking: Reported on 05/06/2021) 05/06/2021: Stopped when she stopped breastfeeding   [DISCONTINUED] norethindrone-ethinyl estradiol-FE (LOESTRIN FE) 1-20 MG-MCG tablet Take 1 tablet by mouth daily. (Patient not taking: Reported on 07/22/2021)    No facility-administered encounter medications on file as of 07/22/2021.   Allergies  Allergen Reactions   Minocycline Hives and Other (See Comments)    Itchy throat    ROS: no fever, chills, n/v/d.  Recent stinging with urination, but none today.  No urgency, frequency or hematuria.  No URI symptoms, chest pain, shortness of breath. Denies abdominal pain.    PHYSICAL EXAM:  BP 132/86   Pulse 80   Temp 98.1 F (36.7 C)   Ht 5' 3.5" (1.613 m)   Wt 170 lb (77.1 kg)   LMP 07/11/2021 (Exact Date)   BMI 29.64 kg/m   Well-appearing female in no distress HEENT: conjunctiva and sclera are clear, EOMI, wearing mask. Neck: no lymphadenopathy or mass Heart: regular rate and rhythm Lungs: clear bilaterally Back: no CVA tenderness Abdomen: soft, nontender. Extremities: no edema Psych: normal mood, affect, hygiene and grooming Neuro: alert and oriented, normal gait  Urine dip negative  ASSESSMENT/PLAN:  History of pyelonephritis - recent pyelo, with minimally abnl u/a. Given slight dysuria yesterday, will send for culture. Flank pain resolved - Plan: Urine Culture, POCT Urinalysis DIP (Proadvantage Device)  Need for COVID-19 vaccine - Plan: Moderna Covid-19 Vaccine Bivalent Booster  Dysuria - discussed  importance of wiping front to back, staying hydrated, not holding urine for long periods, not to ignore sx of UTI - Plan: Urine Culture, POCT Urinalysis DIP (Proadvantage Device)

## 2021-07-22 ENCOUNTER — Other Ambulatory Visit: Payer: Self-pay

## 2021-07-22 ENCOUNTER — Ambulatory Visit: Payer: No Typology Code available for payment source | Admitting: Family Medicine

## 2021-07-22 ENCOUNTER — Encounter: Payer: Self-pay | Admitting: Family Medicine

## 2021-07-22 VITALS — BP 132/86 | HR 80 | Temp 98.1°F | Ht 63.5 in | Wt 170.0 lb

## 2021-07-22 DIAGNOSIS — Z87448 Personal history of other diseases of urinary system: Secondary | ICD-10-CM | POA: Diagnosis not present

## 2021-07-22 DIAGNOSIS — R300 Dysuria: Secondary | ICD-10-CM

## 2021-07-22 DIAGNOSIS — Z23 Encounter for immunization: Secondary | ICD-10-CM

## 2021-07-22 LAB — POCT URINALYSIS DIP (PROADVANTAGE DEVICE)
Bilirubin, UA: NEGATIVE
Blood, UA: NEGATIVE
Glucose, UA: NEGATIVE mg/dL
Ketones, POC UA: NEGATIVE mg/dL
Leukocytes, UA: NEGATIVE
Nitrite, UA: NEGATIVE
Protein Ur, POC: NEGATIVE mg/dL
Specific Gravity, Urine: 1.015
Urobilinogen, Ur: NEGATIVE
pH, UA: 7 (ref 5.0–8.0)

## 2021-07-22 NOTE — Patient Instructions (Signed)
Stay well hydrated. Void after intercourse and try not to hold your urine for too long. Continue to wipe front to back.  If you have worsening symptoms develop while waiting for your urine culture, please contact us so that we can start empiric treatment.  I don't want you to suffer from the culture result taking too long. The urine today was completely clear, but given your lack of significant findings with your recent kidney infection, I'd rather be safe than sorry.

## 2021-07-23 LAB — URINE CULTURE: Organism ID, Bacteria: NO GROWTH

## 2021-10-10 ENCOUNTER — Encounter: Payer: Self-pay | Admitting: Family Medicine

## 2021-10-10 ENCOUNTER — Other Ambulatory Visit: Payer: Self-pay

## 2021-10-10 ENCOUNTER — Ambulatory Visit: Payer: PRIVATE HEALTH INSURANCE | Admitting: Physician Assistant

## 2021-10-10 ENCOUNTER — Encounter: Payer: Self-pay | Admitting: Physician Assistant

## 2021-10-10 ENCOUNTER — Ambulatory Visit
Admission: RE | Admit: 2021-10-10 | Discharge: 2021-10-10 | Disposition: A | Discharge status: Home / Self Care | Payer: PRIVATE HEALTH INSURANCE | Source: Ambulatory Visit | Attending: Physician Assistant | Admitting: Physician Assistant

## 2021-10-10 VITALS — BP 130/80 | HR 98 | Ht 65.0 in | Wt 172.2 lb

## 2021-10-10 DIAGNOSIS — Z01818 Encounter for other preprocedural examination: Secondary | ICD-10-CM

## 2021-10-10 DIAGNOSIS — E65 Localized adiposity: Secondary | ICD-10-CM

## 2021-10-10 DIAGNOSIS — Z01812 Encounter for preprocedural laboratory examination: Secondary | ICD-10-CM | POA: Diagnosis not present

## 2021-10-10 LAB — POCT URINALYSIS DIP (PROADVANTAGE DEVICE)
Bilirubin, UA: NEGATIVE
Blood, UA: NEGATIVE
Glucose, UA: NEGATIVE mg/dL
Ketones, POC UA: NEGATIVE mg/dL
Leukocytes, UA: NEGATIVE
Nitrite, UA: NEGATIVE
Protein Ur, POC: NEGATIVE mg/dL
Specific Gravity, Urine: 1.015
Urobilinogen, Ur: NEGATIVE
pH, UA: 7 (ref 5.0–8.0)

## 2021-10-10 LAB — POC COVID19 BINAXNOW: SARS Coronavirus 2 Ag: NEGATIVE

## 2021-10-10 LAB — POCT URINE PREGNANCY: Preg Test, Ur: NEGATIVE

## 2021-10-10 NOTE — Progress Notes (Addendum)
Acute Office Visit  Subjective:    Patient ID: Hannah Brown, female    DOB: [birth year], 31 y.o.   MRN: [id number]  Chief Complaint  Patient presents with   Surgery clearance    Surgery clearance for March 8th. No other concerns    HPI Patient is in today for pre-op surgical clearance.  She is scheduled for liposuction to be done on 10/23/2021 with [facility], [address], [place], [place], [address].  Past Medical History:  Diagnosis Date   Acne    COVID-19 03/2021   H/O varicella     Past Surgical History:  Procedure Laterality Date   BREAST ENHANCEMENT SURGERY  01/2016   silicone implants   DILATION AND CURETTAGE OF UTERUS  11/2019   for missed Ab    Family History  Problem Relation Age of Onset   Hyperlipidemia Father    Kidney disease Maternal Aunt        on dialysis   Hypertension Maternal Aunt    Congestive Heart Failure Maternal Aunt    Cancer Paternal Aunt        thyroid cancer   Diabetes Paternal Aunt    Diabetes Maternal Grandmother    Kidney disease Maternal Grandmother    Diabetes Paternal Grandmother    Osteoporosis Paternal Grandmother    Atrial fibrillation Paternal Grandmother    Hypercholesterolemia Paternal Grandfather    Hypertension Paternal Grandfather     Social History   Socioeconomic History   Marital status: Married    Spouse name: Not on file   Number of children: Not on file   Years of education: Not on file   Highest education level: Not on file  Occupational History   Occupation: Charity fundraiser    Employer: [employer]  Tobacco Use   Smoking status: Never   Smokeless tobacco: Never  Vaping Use   Vaping Use: Never used  Substance and Sexual Activity   Alcohol use: Yes    Comment: 3-4 glasses of wine/week   Drug use: No   Sexual activity: Yes    Partners: Male    Birth control/protection: Pill  Other Topics Concern   Not on file  Social History Narrative   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    Lives with husband, 3 dogs (mini-schnauzers--the 2 original had a puppy), 1 daughter, Adora Fridge diet.   working part-time at [employer] (mostly days);   In school for FNP at [employer] (will graduate summer 2023)   Social Determinants of Health   Financial Resource Strain: Not on file  Food Insecurity: Not on file  Transportation Needs: Not on file  Physical Activity: Not on file  Stress: Not on file  Social Connections: Not on file  Intimate Partner Violence: Not on file    Outpatient Medications Prior to Visit  Medication Sig Dispense Refill   diphenhydrAMINE (BENADRYL) 25 MG tablet Take 25 mg by mouth every 6 (six) hours as needed.     esomeprazole (NEXIUM) 40 MG capsule Take by mouth.     FLUoxetine (PROZAC) 10 MG capsule Take 10 mg by mouth daily.     FLUoxetine (PROZAC) 10 MG capsule Take by mouth.     loratadine (CLARITIN) 10 MG tablet Take 10 mg by mouth daily.     Melatonin 5 MG TABS Take 1 tablet by mouth as needed.     Multiple Vitamins-Minerals (MULTIVITAMIN WITH MINERALS) tablet Take 1 tablet by  mouth daily.     norethindrone-ethinyl estradiol-FE (LOESTRIN FE) 1-20 MG-MCG tablet Take by mouth.     tretinoin (RETIN-A) 0.025 % cream Apply topically at bedtime. 45 g 11   cefpodoxime (VANTIN) 100 MG tablet Take 100 mg by mouth 2 (two) times daily.     hydrOXYzine (VISTARIL) 25 MG capsule Take by mouth.     No facility-administered medications prior to visit.    Allergies  Allergen Reactions   Minocycline Hives and Other (See Comments)    Itchy throat    Review of Systems  Constitutional:  Negative for activity change and chills.  HENT:  Negative for congestion and voice change.   Eyes:  Negative for pain and redness.  Respiratory:  Negative for cough and wheezing.   Cardiovascular:  Negative for chest pain.  Gastrointestinal:  Negative for constipation, diarrhea, nausea and vomiting.  Endocrine: Negative for polyuria.  Genitourinary:   Negative for frequency.  Skin:  Negative for color change and rash.  Allergic/Immunologic: Negative for immunocompromised state.  Neurological:  Negative for dizziness.  Psychiatric/Behavioral:  Negative for agitation.       Objective:    Physical Exam Vitals and nursing note reviewed.  Constitutional:      General: She is not in acute distress.    Appearance: Normal appearance. She is not ill-appearing.  HENT:     Head: Normocephalic and atraumatic.     Right Ear: External ear normal.     Left Ear: External ear normal.     Nose: No congestion.  Eyes:     Extraocular Movements: Extraocular movements intact.     Conjunctiva/sclera: Conjunctivae normal.     Pupils: Pupils are equal, round, and reactive to light.  Cardiovascular:     Rate and Rhythm: Normal rate and regular rhythm.     Pulses: Normal pulses.     Heart sounds: Normal heart sounds.  Pulmonary:     Effort: Pulmonary effort is normal.     Breath sounds: Normal breath sounds. No wheezing.  Abdominal:     General: Bowel sounds are normal.     Palpations: Abdomen is soft.  Musculoskeletal:     Cervical back: Normal range of motion and neck supple.     Right lower leg: No edema.     Left lower leg: No edema.  Skin:    General: Skin is warm and dry.     Findings: No bruising.  Neurological:     General: No focal deficit present.     Mental Status: She is alert and oriented to person, place, and time.  Psychiatric:        Mood and Affect: Mood normal.        Behavior: Behavior normal.        Thought Content: Thought content normal.    BP 130/80    Pulse 98    Ht 5\' 5"  (1.651 m)    Wt 172 lb 3.2 oz (78.1 kg)    LMP 09/18/2021    SpO2 99%    BMI 28.66 kg/m   Wt Readings from Last 3 Encounters:  10/10/21 172 lb 3.2 oz (78.1 kg)  07/22/21 170 lb (77.1 kg)  05/06/21 168 lb (76.2 kg)    There are no preventive care reminders to display for this patient.  There are no preventive care reminders to display for  this patient.   Lab Results  Component Value Date   TSH 3.069 01/22/2015   Lab Results  Component Value Date  WBC 8.4 04/30/2020   HGB 11.1 04/30/2020   HCT 33.4 (L) 04/30/2020   MCV 80 04/30/2020   PLT 331 04/30/2020   Lab Results  Component Value Date   NA 139 04/20/2019   K 4.7 04/20/2019   CO2 23 04/20/2019   GLUCOSE 90 04/30/2020   BUN 6 04/20/2019   CREATININE 0.75 04/20/2019   BILITOT <0.2 04/20/2019   ALKPHOS 87 04/20/2019   AST 14 04/20/2019   ALT 8 04/20/2019   PROT 7.6 04/20/2019   ALBUMIN 4.0 04/20/2019   CALCIUM 9.7 04/20/2019   Lab Results  Component Value Date   CHOL 201 (H) 05/06/2021   Lab Results  Component Value Date   HDL 58 05/06/2021   Lab Results  Component Value Date   LDLCALC 112 (H) 05/06/2021   Lab Results  Component Value Date   TRIG 176 (H) 05/06/2021   Lab Results  Component Value Date   CHOLHDL 3.5 05/06/2021   No results found for: HGBA1C     Assessment & Plan:   Problem List Items Addressed This Visit       Other   Central adiposity   Other Visit Diagnoses     Pre-operative clearance    -  Primary   Relevant Orders   POCT Urinalysis DIP (Proadvantage Device) (Completed)   POCT urine pregnancy (Completed)   EKG 12-Lead (Completed)   CBC with Differential/Platelet   Comprehensive metabolic panel   Hemoglobin A1c   HIV Antibody (routine testing w rflx)   Novel Coronavirus, NAA ([facility])   Thyroid Panel With TSH   Protime-INR   POC COVID-19 (Completed)   DG Chest 2 View   Pre-operative laboratory examination       Relevant Orders   EKG 12-Lead (Completed)   CBC with Differential/Platelet   Comprehensive metabolic panel   Hemoglobin A1c   HIV Antibody (routine testing w rflx)   Novel Coronavirus, NAA ([facility])   Thyroid Panel With TSH   Protime-INR   POC COVID-19 (Completed)        No orders of the defined types were placed in this encounter.  Above labs, EKG, CXR, and UA ordered per pre-op  clearance request.   All results received and are stable. Patient cleared for surgery.   All results to be faxed to [phone] AND [phone] Phone [phone]  Return at already scheduled appointment.

## 2021-10-11 LAB — COMPREHENSIVE METABOLIC PANEL
ALT: 17 IU/L (ref 0–32)
AST: 19 IU/L (ref 0–40)
Albumin/Globulin Ratio: 1.4 (ref 1.2–2.2)
Albumin: 4.3 g/dL (ref 3.9–5.0)
Alkaline Phosphatase: 114 IU/L (ref 44–121)
BUN/Creatinine Ratio: 10 (ref 9–23)
BUN: 7 mg/dL (ref 6–20)
Bilirubin Total: <0.2 mg/dL (ref 0.0–1.2)
CO2: 23 mmol/L (ref 20–29)
Calcium: 9.4 mg/dL (ref 8.7–10.2)
Chloride: 103 mmol/L (ref 96–106)
Creatinine, Ser: 0.69 mg/dL (ref 0.57–1.00)
Globulin, Total: 3 g/dL (ref 1.5–4.5)
Glucose: 89 mg/dL (ref 70–99)
Potassium: 4.6 mmol/L (ref 3.5–5.2)
Sodium: 139 mmol/L (ref 134–144)
Total Protein: 7.3 g/dL (ref 6.0–8.5)
eGFR: 121 mL/min/{1.73_m2} (ref >59)

## 2021-10-11 LAB — CBC WITH DIFFERENTIAL/PLATELET
Basophils Absolute: 0 10*3/uL (ref 0.0–0.2)
Basos: 1 %
EOS (ABSOLUTE): 0 10*3/uL (ref 0.0–0.4)
Eos: 1 %
Hematocrit: 36.1 % (ref 34.0–46.6)
Hemoglobin: 12 g/dL (ref 11.1–15.9)
Immature Grans (Abs): 0 10*3/uL (ref 0.0–0.1)
Immature Granulocytes: 0 %
Lymphocytes Absolute: 1.7 10*3/uL (ref 0.7–3.1)
Lymphs: 28 %
MCH: 26.7 pg (ref 26.6–33.0)
MCHC: 33.2 g/dL (ref 31.5–35.7)
MCV: 80 fL (ref 79–97)
Monocytes Absolute: 0.5 10*3/uL (ref 0.1–0.9)
Monocytes: 8 %
Neutrophils Absolute: 3.8 10*3/uL (ref 1.4–7.0)
Neutrophils: 62 %
Platelets: 294 10*3/uL (ref 150–450)
RBC: 4.49 x10E6/uL (ref 3.77–5.28)
RDW: 12.6 % (ref 11.7–15.4)
WBC: 6 10*3/uL (ref 3.4–10.8)

## 2021-10-11 LAB — THYROID PANEL WITH TSH
Free Thyroxine Index: 1.8 (ref 1.2–4.9)
T3 Uptake Ratio: 24 % (ref 24–39)
T4, Total: 7.5 ug/dL (ref 4.5–12.0)
TSH: 2.81 u[IU]/mL (ref 0.450–4.500)

## 2021-10-11 LAB — PROTIME-INR
INR: 0.9 (ref 0.9–1.2)
Prothrombin Time: 9.7 s (ref 9.1–12.0)

## 2021-10-11 LAB — HEMOGLOBIN A1C
Est. average glucose Bld gHb Est-mCnc: 100 mg/dL
Hgb A1c MFr Bld: 5.1 % (ref 4.8–5.6)

## 2021-10-11 LAB — NOVEL CORONAVIRUS, NAA: SARS-CoV-2, NAA: NOT DETECTED

## 2021-10-11 LAB — HIV ANTIBODY (ROUTINE TESTING W REFLEX): HIV Screen 4th Generation wRfx: NONREACTIVE

## 2021-10-11 NOTE — Progress Notes (Signed)
Just an FYI - All lab results are done and normal. EKG has been scanned in. She got her chest x-ray done, but I don't see a final report yet, as of 12:55 pm on Friday 10/11/2021. Let me know if you need anything else to fax to the Plastic Surgeon.  Thanks.

## 2021-10-14 ENCOUNTER — Encounter: Payer: Self-pay | Admitting: Physician Assistant

## 2021-10-15 NOTE — Telephone Encounter (Signed)
Suzette Battiest mentioned that she would fax this yesterday.

## 2021-10-16 HISTORY — PX: LIPOSUCTION: SHX10

## 2022-01-05 NOTE — Progress Notes (Signed)
Start time: 12:51 End time: 1pm--pt had to get back to work  Virtual Visit via Video Note  I connected with Hannah Brown on 01/06/22 by a video enabled telemedicine application and verified that I am speaking with the correct person using two identifiers.  Location: Patient: in car outside of work, parked Provider: office   I discussed the limitations of evaluation and management by telemedicine and the availability of in person appointments. The patient expressed understanding and agreed to proceed.  History of Present Illness:  Chief Complaint  Patient presents with   Consult    VIRTUAL consult. Would like to see if you would take over prozac rx.    Patient is requesting we take over her prozac prescription. Per her physical in 04/2021, she was started on 10mg  of prozac in June by a psychiatrist, after she began having more frequent panic attacks.  She states that the plan was to increase to 20mg  21mos ago, she never increased the dose, so didn't f/u with psychiatrist.  She reports that she just increased to the 20mg  dose last week. She has had increased anxiety, hadn't been sleeping well.  She started hydroxyzine about 2 weeks ago, taking it just at night. This helps her sleep. Denies anxiety attacks.  Current stressors include: Divorce finalized 5/8.  She has a 1yo child, she is still in school.  Hasn't had any counseling--doesn't have time. Continues to walk regularly  She underwent liposuction in [place] in March (abdomen).   PMH, PSH, SH reviewed  Outpatient Encounter Medications as of 01/06/2022  Medication Sig Note   FLUoxetine (PROZAC) 10 MG capsule Take 20 mg by mouth. 01/06/2022: Increased to 20mg  dose last week   hydrOXYzine (VISTARIL) 25 MG capsule Take by mouth. 01/06/2022: Started this back recently, with increased anxiety; helps her sleep   loratadine (CLARITIN) 10 MG tablet Take 10 mg by mouth daily. 04/20/2019: Uses in the Spring   tretinoin (RETIN-A) 0.025 %  cream Apply topically at bedtime. 01/06/2022: Once a week   [DISCONTINUED] Melatonin 5 MG TABS Take 1 tablet by mouth as needed.    diphenhydrAMINE (BENADRYL) 25 MG tablet Take 25 mg by mouth every 6 (six) hours as needed. (Patient not taking: Reported on 01/06/2022) 01/06/2022: Prn    Multiple Vitamins-Minerals (MULTIVITAMIN WITH MINERALS) tablet Take 1 tablet by mouth daily. (Patient not taking: Reported on 01/06/2022) 05/06/2021: Stopped when she stopped breastfeeding   [DISCONTINUED] cefpodoxime (VANTIN) 100 MG tablet Take 100 mg by mouth 2 (two) times daily.    [DISCONTINUED] esomeprazole (NEXIUM) 40 MG capsule Take by mouth.    [DISCONTINUED] FLUoxetine (PROZAC) 10 MG capsule Take 10 mg by mouth daily.    [DISCONTINUED] norethindrone-ethinyl estradiol-FE (LOESTRIN FE) 1-20 MG-MCG tablet Take by mouth.    No facility-administered encounter medications on file as of 01/06/2022.   Allergies  Allergen Reactions   Minocycline Hives and Other (See Comments)    Itchy throat   ROS:  Unremarkable--anxiety and insomnia per HPI.  No panic attacks, no GI complaints or other problems.    Observations/Objective:  Ht 5\' 5"  (1.651 m)   Wt 165 lb (74.8 kg)   LMP 01/05/2022 (Exact Date)   BMI 27.46 kg/m  Wt Readings from Last 3 Encounters:  01/06/22 165 lb (74.8 kg)  10/10/21 172 lb 3.2 oz (78.1 kg)  07/22/21 170 lb (77.1 kg)   Pleasant, well-appearing female in good spirits. Normal affect, hygiene Exam is limited due to virtual nature of the visit.  01/06/2022   11:59 AM 04/30/2020   10:14 AM  GAD 7 : Generalized Anxiety Score  Nervous, Anxious, on Edge 1 2  Control/stop worrying 1 1  Worry too much - different things 1 3  Trouble relaxing 1 3  Restless 0 1  Easily annoyed or irritable 0 2  Afraid - awful might happen 1 3  Total GAD 7 Score 5 15  Anxiety Difficulty Not difficult at all      Assessment and Plan:  Generalized anxiety disorder - Significant stressors; doesn't have  time for counseling at this time. Cont 20mg  of prozac and f/u in 6 weeks. Encouraged stress reduction measures - Plan: FLUoxetine (PROZAC) 20 MG capsule  Panic disorder - doing well, no further panic attacks.  - Plan: FLUoxetine (PROZAC) 20 MG capsule  Virutal 6 weeks.   Follow Up Instructions:    I discussed the assessment and treatment plan with the patient. The patient was provided an opportunity to ask questions and all were answered. The patient agreed with the plan and demonstrated an understanding of the instructions.   The patient was advised to call back or seek an in-person evaluation if the symptoms worsen or if the condition fails to improve as anticipated.  I spent 13 minutes dedicated to the care of this patient, including pre-visit review of records, face to face time, post-visit ordering of testing and documentation.    , MD

## 2022-01-06 ENCOUNTER — Encounter: Payer: Self-pay | Admitting: Family Medicine

## 2022-01-06 ENCOUNTER — Telehealth: Payer: PRIVATE HEALTH INSURANCE | Admitting: Family Medicine

## 2022-01-06 VITALS — Ht 65.0 in | Wt 165.0 lb

## 2022-01-06 DIAGNOSIS — F410 Panic disorder [episodic paroxysmal anxiety] without agoraphobia: Secondary | ICD-10-CM | POA: Diagnosis not present

## 2022-01-06 DIAGNOSIS — F411 Generalized anxiety disorder: Secondary | ICD-10-CM

## 2022-01-06 MED ORDER — FLUOXETINE HCL 20 MG PO CAPS: 20.0000 mg | ORAL_CAPSULE | Freq: Every day | ORAL | 0 refills | Status: DC

## 2022-02-02 ENCOUNTER — Encounter: Payer: Self-pay | Admitting: Family Medicine

## 2022-02-02 NOTE — Progress Notes (Signed)
  Start time: End time:  Virtual Visit via Video Note  I connected with Hannah Brown on 02/02/22 by a video enabled telemedicine application and verified that I am speaking with the correct person using two identifiers.  Location: Patient: *** Provider: office   I discussed the limitations of evaluation and management by telemedicine and the availability of in person appointments. The patient expressed understanding and agreed to proceed.  History of Present Illness:  No chief complaint on file.  Patient presents for f/u on anxiety. She was started on 10mg  of prozac in June by a psychiatrist, after she began having more frequent panic attacks.  She had stated that the plan was to increase to 20mg  around January, but she never did, so didn't f/u with psychiatrist.  She had just increased to the 20mg  dose the week before her last visit in May. At that time she had reported having increased anxiety, not sleeping well. She had not been having any anxiety attacks. She had started hydroxyzine at  night, which had been helping her sleep. Stressors at last visit included--divorce being finalized 12/23/21, having 1 yo child, pt still in school. She didn't have time for counseling between work, school, and baby.  She was walking regularly, which helps with moods. Her GAD-7 was 5 at her visit in May was 5 (on the 20mg  prozac only for a week; last GAD-7 in chart was 15 in 04/2020).  Today she reports   PMH, PSH, SH reviewed    ROS: no fever, chills, URI symptoms. No headaches, dizziness, chest pain, shortness of breath. No n/v/d/bowel changes  Sleep? Moods per HPI     Observations/Objective:  LMP 01/05/2022 (Exact Date)    Assessment and Plan:  GAD-7  Prozac 20mg  written #90 5/22  Follow Up Instructions:    I discussed the assessment and treatment plan with the patient. The patient was provided an opportunity to ask questions and all were answered. The patient agreed  with the plan and demonstrated an understanding of the instructions.   The patient was advised to call back or seek an in-person evaluation if the symptoms worsen or if the condition fails to improve as anticipated.  I spent *** minutes dedicated to the care of this patient, including pre-visit review of records, face to face time, post-visit ordering of testing and documentation.    , MD

## 2022-02-03 ENCOUNTER — Telehealth: Payer: PRIVATE HEALTH INSURANCE | Admitting: Family Medicine

## 2022-02-03 ENCOUNTER — Encounter: Payer: Self-pay | Admitting: Family Medicine

## 2022-02-03 VITALS — Ht 65.0 in | Wt 165.4 lb

## 2022-02-03 DIAGNOSIS — F411 Generalized anxiety disorder: Secondary | ICD-10-CM

## 2022-02-03 MED ORDER — FLUOXETINE HCL 10 MG PO TABS: ORAL_TABLET | ORAL | 0 refills | Status: DC

## 2022-02-12 ENCOUNTER — Ambulatory Visit: Payer: PRIVATE HEALTH INSURANCE | Admitting: Family Medicine

## 2022-02-12 ENCOUNTER — Encounter: Payer: Self-pay | Admitting: Family Medicine

## 2022-02-12 VITALS — BP 128/84 | HR 88 | Ht 65.0 in | Wt 166.4 lb

## 2022-02-12 DIAGNOSIS — Z0184 Encounter for antibody response examination: Secondary | ICD-10-CM

## 2022-02-12 DIAGNOSIS — J302 Other seasonal allergic rhinitis: Secondary | ICD-10-CM

## 2022-02-12 DIAGNOSIS — Z113 Encounter for screening for infections with a predominantly sexual mode of transmission: Secondary | ICD-10-CM

## 2022-02-12 DIAGNOSIS — R0683 Snoring: Secondary | ICD-10-CM | POA: Diagnosis not present

## 2022-02-12 NOTE — Patient Instructions (Addendum)
Please be sure to always use condoms when you become sexually active again. Consider trying Breathe-Right strips. Definitely let us know if you develop unrefreshed sleep or other sleep apnea symptoms for testing.  You still have a lot of nasal congestion (from allergies), which is contributing to your snoring. Increase the flonase to 2 sprays into each nostril, and ensure that you are using gentle sniffs to keep the medicine in the nose (and not swallow it).

## 2022-02-12 NOTE — Progress Notes (Signed)
Chief Complaint  Patient presents with   Consult    STD check, no symptoms. Just wanted a routine check.    She has not had any partners since her ex-husband.  Has no symptoms, just wants a full test done prior to any new relationships. No vaginal discharge, odor, itch, urinary symptoms. No lesions or sores. Libido improved since changed to prozac 20mg  qod after recent virtual visit.  She is concerned about her snoring. Hears herself snoring in the morning as she wakes up--it does not wake her up.  Not waking up in the middle of the night, not gasping for air, no daytime somnolence.  Feels refreshed in the mornings. No morning headaches.  Last month she started using Flonase--for allergies. It helped with the allergies, no significant change noted in the snoring. She used it as directed on the box--2 sprays the first week, and now 1 spray into each nostril daily.  PMH, PSH, SH reviewed and updated  Outpatient Encounter Medications as of 02/12/2022  Medication Sig Note   FLUoxetine (PROZAC) 10 MG tablet Take 1-1.5 tablets by mouth daily, as directed 02/12/2022: Taking 20mg  every other day to use up supply   fluticasone (FLONASE) 50 MCG/ACT nasal spray Place 2 sprays into both nostrils daily. 02/12/2022: Using 1 spray currently (used 2 for the first week)   loratadine (CLARITIN) 10 MG tablet Take 10 mg by mouth daily. 04/20/2019: Uses in the Spring   Multiple Vitamins-Minerals (MULTIVITAMIN WITH MINERALS) tablet Take 1 tablet by mouth daily.    tretinoin (RETIN-A) 0.025 % cream Apply topically at bedtime. 01/06/2022: Once a week   diphenhydrAMINE (BENADRYL) 25 MG tablet Take 25 mg by mouth every 6 (six) hours as needed. (Patient not taking: Reported on 02/03/2022) 01/06/2022: Prn    hydrOXYzine (VISTARIL) 25 MG capsule Take by mouth. (Patient not taking: Reported on 02/03/2022) 02/03/2022: Uses prn, not needing   No facility-administered encounter medications on file as of 02/12/2022.   Allergies   Allergen Reactions   Minocycline Hives and Other (See Comments)    Itchy throat    ROS: no fever, chills, URI symptoms, headaches, n/v/d. No vaginal discharge, urinary complaints or pelvic pains.  No skins lesions, sores.  +snoring. Allergies improved with flonase.  No fatigue or daytime somnolence. See HPI   PHYSICAL EXAM:  BP 128/84   Pulse 88   Ht 5\' 5"  (1.651 m)   Wt 166 lb 6.4 oz (75.5 kg)   LMP 01/16/2022   BMI 27.69 kg/m   Pleasant, well-appearing female, in good spirits HEENT: conjunctiva and sclera are clear, EOMI Nasal mucosa with mod edema L>R, no erythema or purulence Sinuses nontender. OP clear Neck: no lymphadenopathy Heart: regular rate and rhythm Lungs: clear bilaterally Psych: normal mood, affect, hygiene and grooming Neuro: alert and oriented, cranial nerves intact, normal gait.  ASSESSMENT/PLAN:  Screen for STD (sexually transmitted disease) - counseled re: safe sex measures when she becomes sexually active in the future, as well as plan B if doesn't restart OCPs - Plan: HIV Antibody (routine testing w rflx), RPR, Hepatitis B surface antigen, Hepatitis B surface antibody,qualitative, GC/Chlamydia Probe Amp  Immunity status testing - pt unsure if she had titers done for HepB, none found. Rec checking titers (due to job), and Heplisav if Negative - Plan: Hepatitis B surface antibody,qualitative  Seasonal allergic rhinitis, unspecified trigger - not adequately treated, possibly related to technique. Discuss 2 spray/nostril, gentle sniffs  Snoring - suspect related to nasal congestion and allergies. No sx  to suggest OSA, reassured.   HIV/RPR,  Hepb Sag, Sab GC/chlamydia

## 2022-02-13 LAB — RPR: RPR Ser Ql: NONREACTIVE

## 2022-02-13 LAB — HEPATITIS B SURFACE ANTIBODY,QUALITATIVE: Hep B Surface Ab, Qual: REACTIVE

## 2022-02-13 LAB — HEPATITIS B SURFACE ANTIGEN: Hepatitis B Surface Ag: NEGATIVE

## 2022-02-13 LAB — HIV ANTIBODY (ROUTINE TESTING W REFLEX): HIV Screen 4th Generation wRfx: NONREACTIVE

## 2022-02-14 LAB — GC/CHLAMYDIA PROBE AMP
Chlamydia trachomatis, NAA: NEGATIVE
Neisseria Gonorrhoeae by PCR: NEGATIVE

## 2022-04-23 ENCOUNTER — Encounter: Payer: Self-pay | Admitting: Internal Medicine

## 2022-05-18 NOTE — Progress Notes (Unsigned)
No chief complaint on file.   Hannah Brown is a 31 y.o. female who presents for a complete physical and f/u on anxiety,  Anxiety--last seen in 01/2022. At that point she was doing very well with respect to anxiety on the 58m of prozac, but was having libido issues and trouble achieving orgasm. She had tolerated the 121mdose (but didn't adequately control her anxiety). She had a lot of 2054mapsules left. We had discussed using the capsules 4x/week (and skip M/W/F), vs change to the 35m76mblets and take 1.5 (15mg105mily, and possibly reducing the dose back to 35mg 75mr her boards. She reported improvement in libido very quickly with switching to 20mg q65mreported at late June visit).  She is currently taking  She denies any anxiety attacks. At one point she needed hydroxyzine for sleep, but hasn't needed this, sleep improved.  Stressors previously included divorce, single mom of 1 yo [id number], and had still been in school.   She completed her last semester, and took her boards in August. [place] and [place]:  didn't have unrefreshed sleep or daytime somnolence to suggest OSA. She did have allergies, and we discussed her Flonase dose and technique at June visit.  Recommended increasing to 2 sprays into each nostril, gentle sniffs. UPDATE snoring/allergies.  H/o cystic acne.  Through [facility] derm was treated in the past with Spironolactone, Epiduo. She couldn't tolerate Accutane, only took for 2 months.   She used Epiduo when mask acne developed, stopped during pregnancy.  She was given rx for Retin-A last year to try. Acne is around her nose/cheeks, never gets the cystic lesions anymore. UPDATE She saw derm in 11/2021 for evaluation of scarring at her C-section site and under her breasts (s/p liposuction).  Felt to have hypertrophic scars, injected with TAC. She is scheduled for f/u later this  month.  Mild hyperlipidemia:  LDL was up to 141 in 2021,  improved to 112 last year.  TG have been elevated, 210 in 2020, 176 on last check last year. She continues to follow a vegan diet. Due for recheck.  Lab Results  Component Value Date   CHOL 201 (H) 05/06/2021   HDL 58 05/06/2021   LDLCALC 112 (H) 05/06/2021   TRIG 176 (H) 05/06/2021   CHOLHDL 3.5 05/06/2021    Immunization History  Administered Date(s) Administered   DTaP 07/31/1993, 11/06/1993, 01/24/1994, 11/06/1994, 02/23/1998   HPV Quadrivalent 01/07/2012, 03/01/2012, 01/17/2013   Hepatitis A 10/10/2010, 01/07/2012   Hepatitis B 10/15/109/03/1993/1994, 05/02/1994   IPV 07/31/1993, 11/06/1993, 05/02/1994, 02/23/1998   Influenza Split 06/27/2013, 06/10/2014   Influenza,inj,Quad PF,6+ Mos 04/20/2019, 04/30/2020, 05/06/2021   Influenza-Unspecified 06/06/2016, 06/11/2017, 06/10/2018   MMR 11/06/1994, 02/23/1998   Meningococcal Conjugate 10/10/2010   Moderna Covid-19 Vaccine Bivalent Booster 70yrs &44yr2/12/2020   PFIZER(Purple Top)SARS-COV-2 Vaccination 09/02/2019, 09/23/2019, 05/01/2020   Td 09/20/2010   Tdap 10/11/2011, 11/19/2020   Last Pap smear: 04/2020, normal Contraception:  (None? Not in relationship in June)  She had negative STD check 01/2022.  Last mammogram: never   Last colonoscopy: never   Last DEXA: never   Dentist: twice yearly   Ophtho:yearly Exercise:  Walks 3 miles 4 days/week (with stroller, takes 1 hour). Does some YouTube works out with weights.[employer] baby. Vitamin D-OH screen 01/2015 normal at 32 Hg 11.2 at post-partum f/u 01/2021  Vegan diet.   PMH, PSH, SH and FH reviewed and updated   ROS: The patient denies anorexia, fever,  headaches, vision changes, weight changes, decreased hearing, ear pain, sore throat, breast concerns, chest pain, palpitations, dizziness, syncope, dyspnea on exertion, cough, swelling, nausea, vomiting, diarrhea, constipation, abdominal pain, melena, hematochezia, indigestion/heartburn, hematuria, incontinence,  dysuria, irregular menstrual cycles, genital lesions, vaginal discharge, joint pains, numbness, tingling, weakness, tremor, suspicious skin lesions, abnormal bleeding/bruising, or enlarged lymph nodes. Seasonal allergies--controlled with flonase Acne, mild on cheeks Anxiety improved, no panic attacks Libido improved.  PHYSICAL EXAM:  There were no vitals taken for this visit.   Wt Readings from Last 3 Encounters:  02/12/22 166 lb 6.4 oz (75.5 kg)  02/03/22 165 lb 6.4 oz (75 kg)  01/06/22 165 lb (74.8 kg)    General Appearance:    Alert, cooperative, no distress, appears stated age     Head:    Normocephalic, without obvious abnormality, atraumatic     Eyes:    PERRL, conjunctiva/corneas clear, EOM's intact, fundi benign   Ears:    Normal TM's and external ear canals     Nose:    Not examined (wearing mask due to COVID-19 pandemic)     Throat:    Not examined (wearing mask due to COVID-19 pandemic)   Neck:    Supple, no lymphadenopathy; thyroid: no enlargement/  tenderness/nodules; no carotid bruit or JVD     Back:    Spine nontender, no curvature, ROM normal, no CVA tenderness   Lungs:    Clear to auscultation bilaterally without wheezes, rales or ronchi; respirations unlabored     Chest Wall:    No tenderness or deformity     Heart:    Regular rate and rhythm, S1 and S2 normal, no murmur, rub or gallop     Breast Exam:    Deferred to GYN  Abdomen:    Soft, non-tender, nondistended, normoactive bowel sounds,   no masses, no hepatosplenomegaly. Umbilical piercing  Genitalia:    Deferred to GYN  Rectal:    Not performed due to age<40 and no related complaints     Extremities:    No clubbing, cyanosis or edema     Pulses:    2+ and symmetric all extremities     Skin:    Skin color, texture, turgor normal, no rashes or lesions.  skin on back is clear. Brief exam of face (mask pulled down) shows no evidence of inflammatory papules, a few small papules and whiteheads noted on cheeks and  near nose, at mask edge.  Lymph nodes:    Cervical, supraclavicular, and inguinal nodes normal     Neurologic:    Normal strength, sensation and gait; reflexes 2+ and symmetric throughout                          Psych:   Normal mood, affect, hygiene and grooming  ***update scars--C-section, under breasts  ASSESSMENT/PLAN:  ?still seeing GYN? Does she plan to??  Need to know if I'm doing breast/pelvic or not.  Enter flu shot if she had, offer. COVID booster rec  Lipids, CBC, B12, D ?chem? glu  Discussed monthly self breast exams and yearly mammograms after the age of 40; at least 30 minutes of aerobic activity at least 5 days/week, weight-bearing exercise at least 2x/wk; proper sunscreen use reviewed; healthy diet, including goals of calcium and vitamin D intake and alcohol recommendations (less than or equal to 1 drink/day) reviewed; regular seatbelt use; changing batteries in smoke detectors. Immunization recommendations discussed--continue yearly flu shots Updated COVID booster recommended (  wait 2 weeks from flu shot)  F/u 1 year, sooner prn.

## 2022-05-18 NOTE — Patient Instructions (Incomplete)
  HEALTH MAINTENANCE RECOMMENDATIONS:  It is recommended that you get at least 30 minutes of aerobic exercise at least 5 days/week (for weight loss, you may need as much as 60-90 minutes). This can be any activity that gets your heart rate up. This can be divided in 10-15 minute intervals if needed, but try and build up your endurance at least once a week.  Weight bearing exercise is also recommended twice weekly.  Eat a healthy diet with lots of vegetables, fruits and fiber.  "Colorful" foods have a lot of vitamins (ie green vegetables, tomatoes, red peppers, etc).  Limit sweet tea, regular sodas and alcoholic beverages, all of which has a lot of calories and sugar.  Up to 1 alcoholic drink daily may be beneficial for women (unless trying to lose weight, watch sugars).  Drink a lot of water.  Calcium recommendations are 1200-1500 mg daily (1500 mg for postmenopausal women or women without ovaries), and vitamin D 1000 IU daily.  This should be obtained from diet and/or supplements (vitamins), and calcium should not be taken all at once, but in divided doses.  Monthly self breast exams and yearly mammograms for women over the age of 24 is recommended.  Sunscreen of at least SPF 30 should be used on all sun-exposed parts of the skin when outside between the hours of 10 am and 4 pm (not just when at beach or pool, but even with exercise, golf, tennis, and yard work!)  Use a sunscreen that says "broad spectrum" so it covers both UVA and UVB rays, and make sure to reapply every 1-2 hours.  Remember to change the batteries in your smoke detectors when changing your clock times in the spring and fall. Carbon monoxide detectors are recommended for your home.  Use your seat belt every time you are in a car, and please drive safely and not be distracted with cell phones and texting while driving.  COVID booster is recommended.  Wait 2 weeks from today's flu shot.  Let us know when you need a prozac refill,  and if you prefer to continue 20mg  every other day vs 10mg  daily (capsule vs tablet).  Please be sure to check your blood pressure once you are back on birth control pills, and let [place] know if it is high (>135/85). Start the birth control pills with your next cycle. Please use condoms regularly for prevention of STD's (and if you ever take an antibiotic, or with missed doses of pills).

## 2022-05-19 ENCOUNTER — Ambulatory Visit: Payer: PRIVATE HEALTH INSURANCE | Admitting: Family Medicine

## 2022-05-19 ENCOUNTER — Encounter: Payer: Self-pay | Admitting: Family Medicine

## 2022-05-19 VITALS — BP 132/82 | HR 80 | Ht 64.0 in | Wt 165.2 lb

## 2022-05-19 DIAGNOSIS — Z789 Other specified health status: Secondary | ICD-10-CM

## 2022-05-19 DIAGNOSIS — E782 Mixed hyperlipidemia: Secondary | ICD-10-CM

## 2022-05-19 DIAGNOSIS — J302 Other seasonal allergic rhinitis: Secondary | ICD-10-CM

## 2022-05-19 DIAGNOSIS — F411 Generalized anxiety disorder: Secondary | ICD-10-CM

## 2022-05-19 DIAGNOSIS — Z23 Encounter for immunization: Secondary | ICD-10-CM | POA: Diagnosis not present

## 2022-05-19 DIAGNOSIS — L700 Acne vulgaris: Secondary | ICD-10-CM

## 2022-05-19 DIAGNOSIS — Z30011 Encounter for initial prescription of contraceptive pills: Secondary | ICD-10-CM

## 2022-05-19 DIAGNOSIS — Z0000 Encounter for general adult medical examination without abnormal findings: Secondary | ICD-10-CM | POA: Diagnosis not present

## 2022-05-19 DIAGNOSIS — E559 Vitamin D deficiency, unspecified: Secondary | ICD-10-CM

## 2022-05-19 DIAGNOSIS — Z113 Encounter for screening for infections with a predominantly sexual mode of transmission: Secondary | ICD-10-CM

## 2022-05-19 MED ORDER — TRETINOIN 0.025 % EX CREA: TOPICAL_CREAM | Freq: Every day | CUTANEOUS | 11 refills | Status: DC

## 2022-05-19 MED ORDER — NORETHINDRON-ETHINYL ESTRAD-FE 1-20/1-30/1-35 MG-MCG PO TABS: 1.0000 | ORAL_TABLET | Freq: Every day | ORAL | 3 refills | Status: DC

## 2022-05-20 DIAGNOSIS — E559 Vitamin D deficiency, unspecified: Secondary | ICD-10-CM | POA: Insufficient documentation

## 2022-05-20 LAB — LIPID PANEL
Chol/HDL Ratio: 3.8 ratio (ref 0.0–4.4)
Cholesterol, Total: 236 mg/dL — ABNORMAL HIGH (ref 100–199)
HDL: 62 mg/dL (ref >39)
LDL Chol Calc (NIH): 152 mg/dL — ABNORMAL HIGH (ref 0–99)
Triglycerides: 123 mg/dL (ref 0–149)
VLDL Cholesterol Cal: 22 mg/dL (ref 5–40)

## 2022-05-20 LAB — CBC WITH DIFFERENTIAL/PLATELET
Basophils Absolute: 0 10*3/uL (ref 0.0–0.2)
Basos: 0 %
EOS (ABSOLUTE): 0.1 10*3/uL (ref 0.0–0.4)
Eos: 1 %
Hematocrit: 36.2 % (ref 34.0–46.6)
Hemoglobin: 12 g/dL (ref 11.1–15.9)
Immature Grans (Abs): 0 10*3/uL (ref 0.0–0.1)
Immature Granulocytes: 0 %
Lymphocytes Absolute: 1.5 10*3/uL (ref 0.7–3.1)
Lymphs: 27 %
MCH: 26.9 pg (ref 26.6–33.0)
MCHC: 33.1 g/dL (ref 31.5–35.7)
MCV: 81 fL (ref 79–97)
Monocytes Absolute: 0.4 10*3/uL (ref 0.1–0.9)
Monocytes: 7 %
Neutrophils Absolute: 3.7 10*3/uL (ref 1.4–7.0)
Neutrophils: 65 %
Platelets: 296 10*3/uL (ref 150–450)
RBC: 4.46 x10E6/uL (ref 3.77–5.28)
RDW: 12.3 % (ref 11.7–15.4)
WBC: 5.6 10*3/uL (ref 3.4–10.8)

## 2022-05-20 LAB — VITAMIN B12: Vitamin B-12: 636 pg/mL (ref 232–1245)

## 2022-05-20 LAB — GC/CHLAMYDIA PROBE AMP
Chlamydia trachomatis, NAA: NEGATIVE
Neisseria Gonorrhoeae by PCR: NEGATIVE

## 2022-05-20 LAB — RPR: RPR Ser Ql: NONREACTIVE

## 2022-05-20 LAB — VITAMIN D 25 HYDROXY (VIT D DEFICIENCY, FRACTURES): Vit D, 25-Hydroxy: 10.8 ng/mL — ABNORMAL LOW (ref 30.0–100.0)

## 2022-05-20 LAB — HIV ANTIBODY (ROUTINE TESTING W REFLEX): HIV Screen 4th Generation wRfx: NONREACTIVE

## 2022-05-20 MED ORDER — VITAMIN D (ERGOCALCIFEROL) 1.25 MG (50000 UNIT) PO CAPS: 50000.0000 [IU] | ORAL_CAPSULE | ORAL | 0 refills | Status: DC

## 2022-05-20 NOTE — Addendum Note (Signed)
Addended by: Rita Ohara on: 05/20/2022 08:23 AM   Modules accepted: Orders

## 2022-10-05 ENCOUNTER — Other Ambulatory Visit: Payer: Self-pay | Admitting: Family Medicine

## 2022-10-05 DIAGNOSIS — F411 Generalized anxiety disorder: Secondary | ICD-10-CM

## 2022-10-06 ENCOUNTER — Other Ambulatory Visit: Payer: Self-pay | Admitting: *Deleted

## 2022-10-06 MED ORDER — FLUOXETINE HCL 10 MG PO CAPS: 10.0000 mg | ORAL_CAPSULE | Freq: Every day | ORAL | 1 refills | Status: DC

## 2023-02-11 IMAGING — DX DG CHEST 2V
2 series · 2 of 2 positions shown · non-contrast
Comparison: None.

CLINICAL DATA: Pre-op clearance exam

EXAM:
CHEST - 2 VIEW

[dg chest 2 view (1 of 2)]
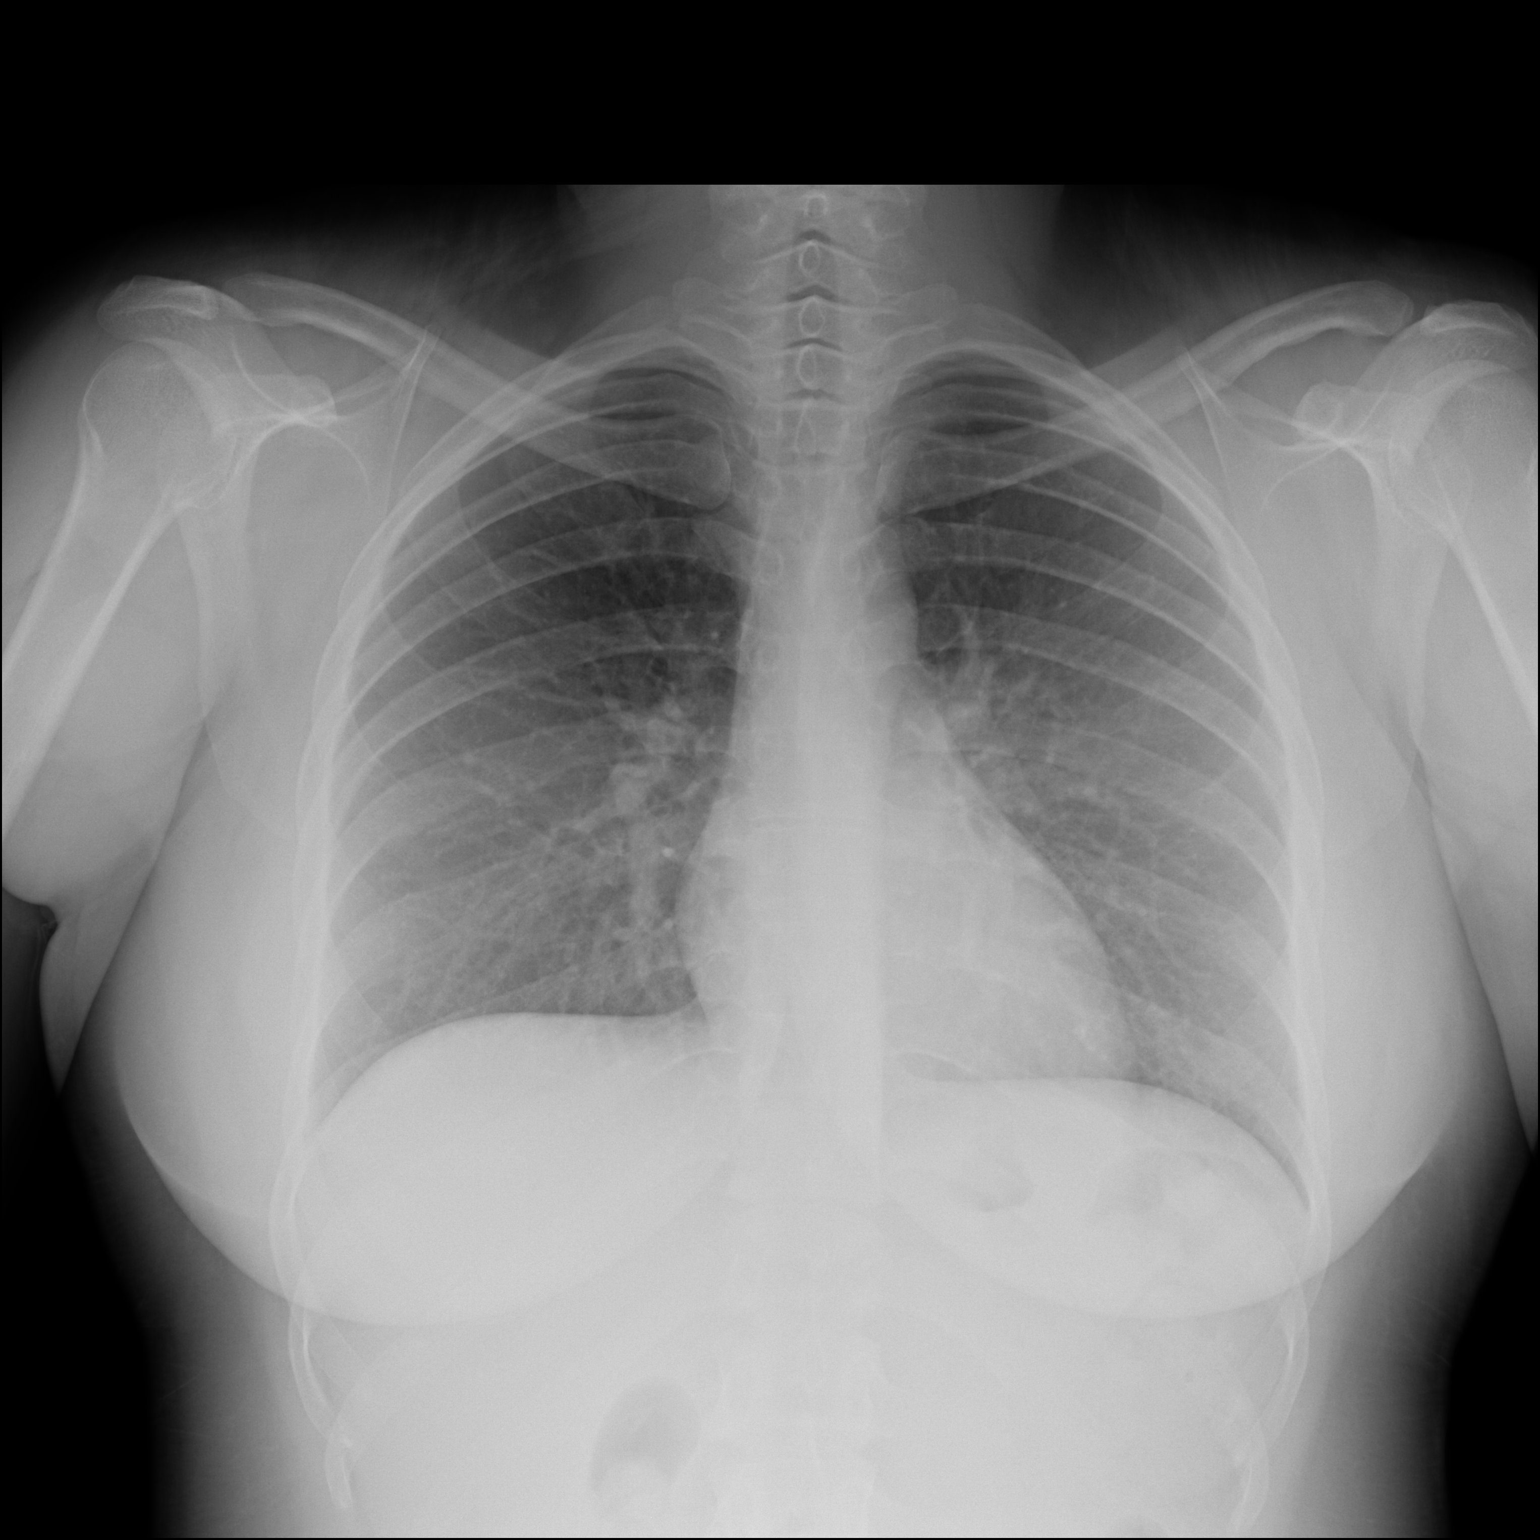

[dg chest 2 view (2 of 2)]
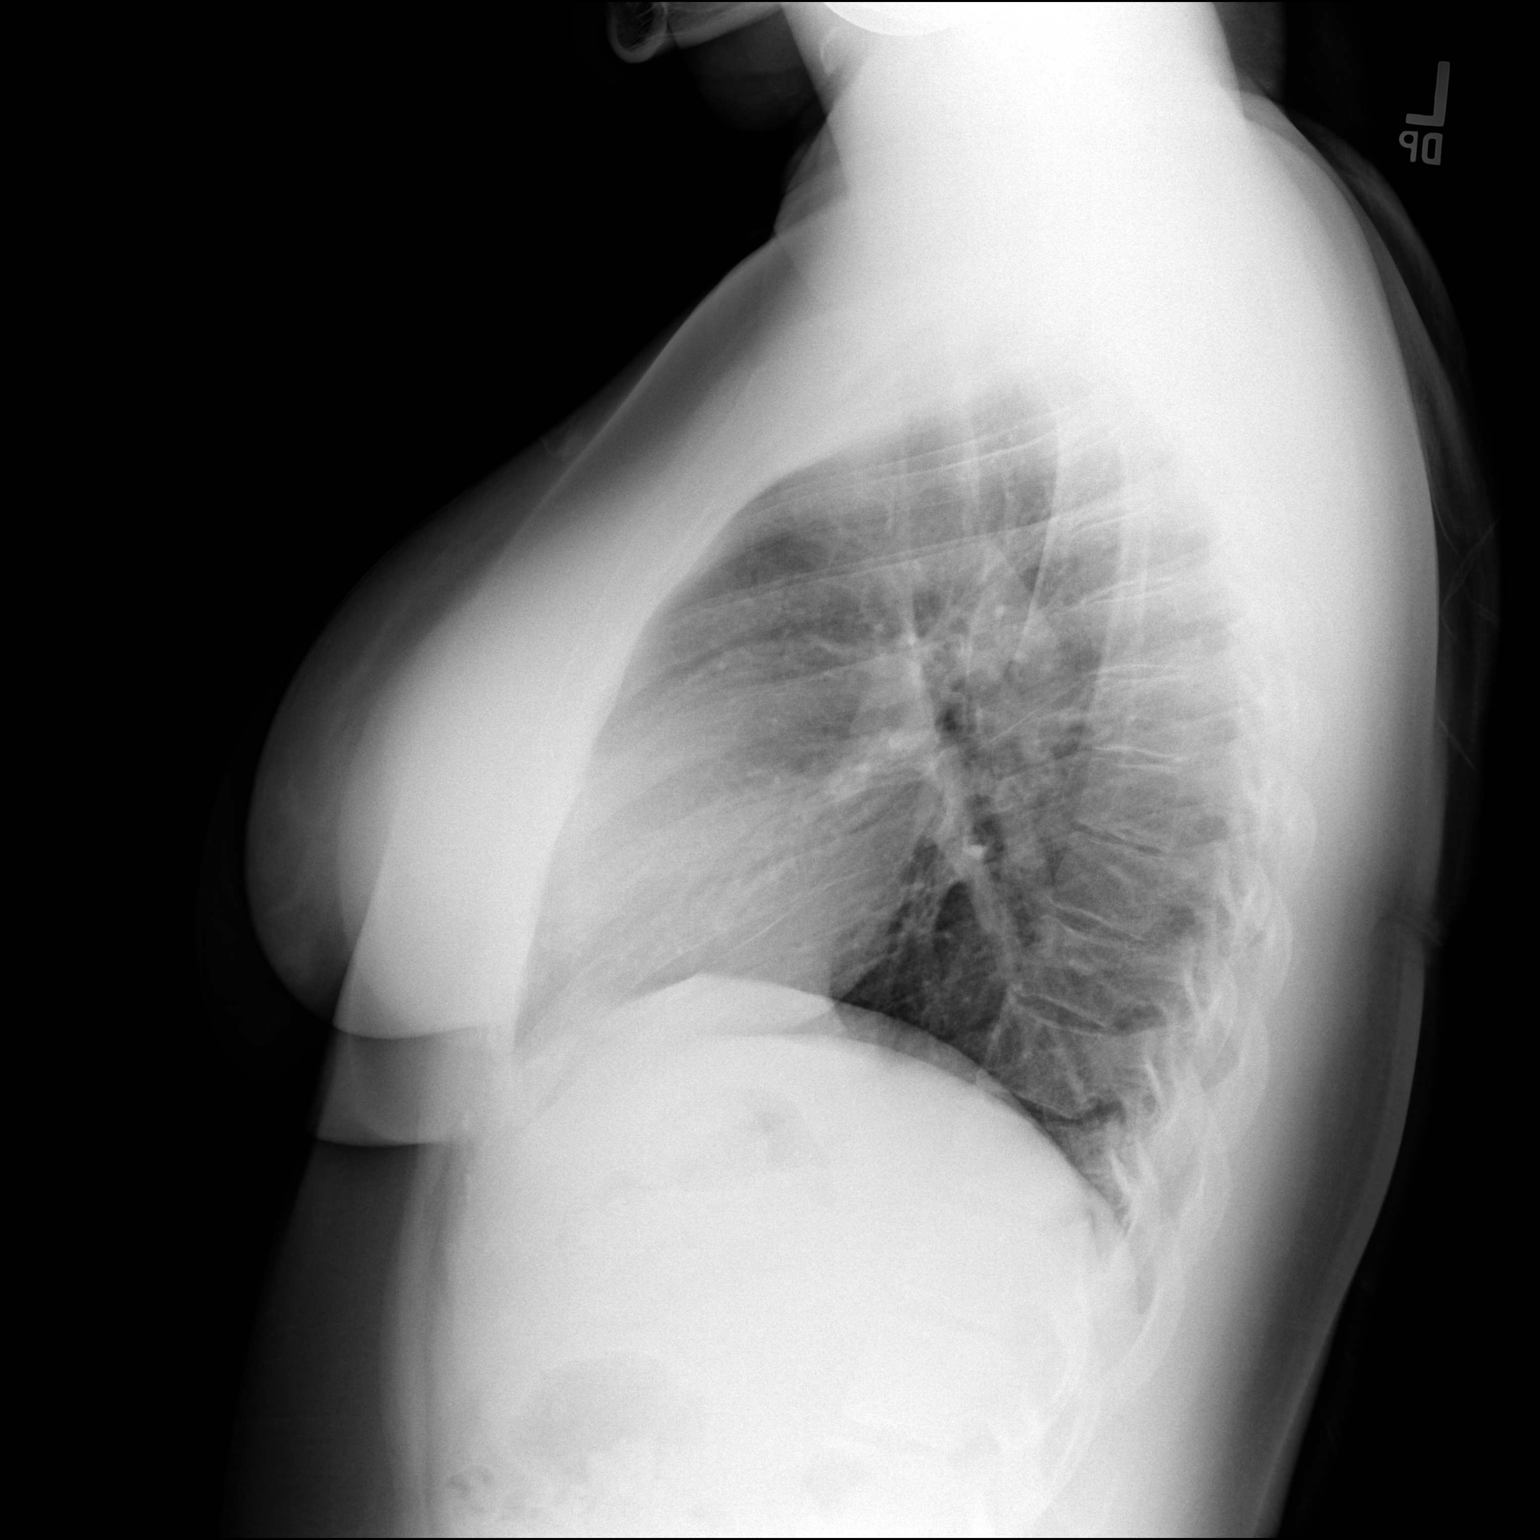

[2 of 2 positions shown; findings below may reference images not displayed]

FINDINGS: The heart size and mediastinal contours are within normal limits.
Both lungs are clear. The visualized skeletal structures are
unremarkable.
IMPRESSION: Normal exam.

## 2023-03-16 DIAGNOSIS — R519 Headache, unspecified: Secondary | ICD-10-CM | POA: Diagnosis not present

## 2023-03-17 ENCOUNTER — Other Ambulatory Visit: Payer: Self-pay

## 2023-03-17 MED ORDER — FLUOXETINE HCL 10 MG PO CAPS: 10.0000 mg | ORAL_CAPSULE | Freq: Every day | ORAL | 2 refills | Status: DC

## 2023-03-17 NOTE — Telephone Encounter (Signed)
Looks like she had 6 month rx sent 2/19, so should have enough to last until 8/19.  I saw that she went to urgent care today, and has an appointment on Monday to follow-up.  Does she need this before Monday?  If so, it may be because she increased the dose?  Verify what she has been taking if truly needs RF now (to determine if refill should be changed, or only given a 30d supply to last until visit, rather than giving the 90d supply that I pended, that should last until her next physical in October.

## 2023-03-17 NOTE — Telephone Encounter (Signed)
Pt left voicemail requesting prozac rx to be sent to [facility] on [address] in [place]. Last appt 05/19/2022

## 2023-03-17 NOTE — Telephone Encounter (Signed)
Called pt to ask for additional info. She only received a 30 day supply because that is what her insurance will cover. Pt has been taken them one a day as prescribed and only has one more. Has requested rx to be sent to [facility] on [address] in [place].

## 2023-03-21 NOTE — Progress Notes (Unsigned)
No chief complaint on file.  Patient presents for UC follow-up. She was seen 7/29 with headache. Headache had started 7/25.She was having a hard time sleeping due to HA. She noted sounds being muffled, nausea and dizziness. No vomiting.  Felt worse bending over and turning her head made the pain worse.  Ibuprofen and tylenol didn't help. No prior h/o migraines. BP was elevated at visit, 141/92.  Neuro exam was normal. 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    PMH, PSH, SH reviewed   ROS:   PHYSICAL EXAM:  There were no vitals taken for this visit.  Wt Readings from Last 3 Encounters:  05/19/22 165 lb 3.2 oz (74.9 kg)  02/12/22 166 lb 6.4 oz (75.5 kg)  02/03/22 165 lb 6.4 oz (75 kg)      ASSESSMENT/PLAN:

## 2023-03-23 ENCOUNTER — Encounter: Payer: Self-pay | Admitting: Family Medicine

## 2023-03-23 ENCOUNTER — Ambulatory Visit: Payer: 59 | Admitting: Family Medicine

## 2023-03-23 VITALS — BP 130/76 | HR 76 | Ht 64.0 in | Wt 173.0 lb

## 2023-03-23 DIAGNOSIS — R519 Headache, unspecified: Secondary | ICD-10-CM | POA: Diagnosis not present

## 2023-03-23 DIAGNOSIS — J302 Other seasonal allergic rhinitis: Secondary | ICD-10-CM

## 2023-03-23 NOTE — Patient Instructions (Signed)
  Your neurologic exam is normal. I'm guessing that there may have been a component of sinus issues (pain behind eyes, worse leaning forward, and some eustachian tube dysfunction possibly causing the muffled hearing and dizziness).  This could have triggered more of a migraine. If you have recurrent symptoms, try adding a decongestant, take an antihistamine, consider sinus rinses, mucinex.  If you have frequent headaches, we should re-evaluate.  Keep a journal and track triggers, location of pain, etc.

## 2023-05-13 DIAGNOSIS — Z6829 Body mass index (BMI) 29.0-29.9, adult: Secondary | ICD-10-CM | POA: Diagnosis not present

## 2023-05-13 DIAGNOSIS — L237 Allergic contact dermatitis due to plants, except food: Secondary | ICD-10-CM | POA: Diagnosis not present

## 2023-05-20 ENCOUNTER — Other Ambulatory Visit: Payer: Self-pay | Admitting: Family Medicine

## 2023-06-09 ENCOUNTER — Telehealth: Payer: Self-pay | Admitting: Family Medicine

## 2023-06-09 MED ORDER — FLUOXETINE HCL 10 MG PO CAPS: 10.0000 mg | ORAL_CAPSULE | Freq: Every day | ORAL | 0 refills | Status: DC

## 2023-06-09 NOTE — Telephone Encounter (Signed)
Done

## 2023-06-09 NOTE — Telephone Encounter (Signed)
Pt left message that she will not have enough Fluoxetine until her appt & would like refill

## 2023-06-10 ENCOUNTER — Encounter: Payer: Self-pay | Admitting: *Deleted

## 2023-06-10 ENCOUNTER — Encounter: Payer: Self-pay | Admitting: Family Medicine

## 2023-06-10 NOTE — Progress Notes (Unsigned)
No chief complaint on file.   Hannah Brown is a 32 y.o. female who presents for a complete physical and f/u on anxiety,  She was last seen in August as UC f/u on HA.   Anxiety-- She is doing well on fluoxetine 10 mg daily.  She previously had libido issues and trouble achieving orgasm on the 20 mg dose. Denies any sexual side effects at the lower dose, and reports that moods are good. Denies any anxiety attacks. Sleeping well, no longer needs any hydroxyzine. 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 She is doing well with single parenting (2 yo daughter)  Denies significant stressors currently.   Allergies:  Allergies are controlled with use of Flonase and claritin.  H/o cystic acne.  Through [facility] derm was treated in the past with Spironolactone, Epiduo. She couldn't tolerate Accutane, only took for 2 months.  She used Epiduo when mask acne developed, stopped during pregnancy.  Acne has been well controlled using Retin-A, using it about once a week. Acne is usually around her nose/cheeks, never gets the cystic lesions anymore.    Mild hyperlipidemia:  LDL was up to 152 last year (up from 112 the year before, 3 years ago), TG and been good (previously had some elevations in TG). She continues to follow a vegan diet.  Doesn't eat much fried foods, some sweets (likes to bake). Due for recheck.  Component Ref Range & Units 1 yr ago 2 yr ago 3 yr ago 4 yr ago 7 yr ago 11 yr ago  Cholesterol, Total 100 - 199 mg/dL 161 High  096 High  045 High  228 High     Triglycerides 0 - 149 mg/dL 409 811 High  85 914 High  129 R 111 R  HDL >39 mg/dL 62 58 57 59 71 R 53 R  VLDL Cholesterol Cal 5 - 40 mg/dL 22 31 15  37    LDL Chol Calc (NIH) 0 - 99 mg/dL 782 High  956 High  213 High  132 High     Chol/HDL Ratio 0.0 - 4.4 ratio 3.8 3.5 CM 3.7 CM 3.9 CM 2.9 R 3.7 R   Vitamin D deficiency:  level was low at 10.8 in 05/2022. She was  treated with 12 weeks of prescription vitamin D, and was encouraged to take 2000 IU daily. She is currently taking   Immunization History  Administered Date(s) Administered   DTaP 07/31/1993, 11/06/1993, 01/24/1994, 11/06/1994, 02/23/1998   HPV Quadrivalent 01/07/2012, 03/01/2012, 01/17/2013   Hepatitis A 10/10/2010, 01/07/2012   Hepatitis B 05-27-93, 07/31/1993, 05/02/1994   IPV 07/31/1993, 11/06/1993, 05/02/1994, 02/23/1998   Influenza Split 06/27/2013, 06/10/2014   Influenza,inj,Quad PF,6+ Mos 04/20/2019, 04/30/2020, 05/06/2021, 05/19/2022   Influenza-Unspecified 06/06/2016, 06/11/2017, 06/10/2018   MMR 11/06/1994, 02/23/1998   Meningococcal Conjugate 10/10/2010   Moderna Covid-19 Vaccine Bivalent Booster 25yrs & up 07/22/2021   PFIZER(Purple Top)SARS-COV-2 Vaccination 09/02/2019, 09/23/2019, 05/01/2020   Td 09/20/2010   Tdap 10/11/2011, 11/19/2020   Last Pap smear: 04/2020, normal Contraception: None. Currently in a sexual relationship, not using condoms. Tracking cycles. Thinking about IUD, worried about the discomfort.  ***UPDATE Willing to restart pills today (took in the past without SE or problems) She had negative STD check 01/2022, prior to start of this relationship.  Last mammogram: never   Last colonoscopy: never   Last DEXA: never   Dentist: twice yearly   Ophtho:yearly Exercise: Walks 3 miles 4 days/week (with stroller,  takes 1 hour).  Elliptical if the weather is poor. +lifting baby (20#)  Vitamin D-OH screen 01/2015 normal at 32; down to 10.8 in 05/2022.  Vegan diet. Normal B12 Lab Results  Component Value Date   VITAMINB12 636 05/19/2022      PMH, PSH, SH and FH reviewed and updated     ROS: The patient denies anorexia, fever, headaches, vision changes, weight changes, decreased hearing, ear pain, sore throat, breast concerns, chest pain, palpitations, dizziness, syncope, dyspnea on exertion, cough, swelling, nausea, vomiting, diarrhea,  constipation, abdominal pain, melena, hematochezia, indigestion/heartburn, hematuria, incontinence, dysuria, irregular menstrual cycles, genital lesions, vaginal discharge, joint pains, numbness, tingling, weakness, tremor, suspicious skin lesions, abnormal bleeding/bruising, or enlarged lymph nodes. Seasonal allergies--controlled with flonase, claritin Acne is well controlled with Retin-A (using it just once a week). Anxiety improved, no panic attacks No sexual side effects from SSRI     PHYSICAL EXAM:  There were no vitals taken for this visit.   Wt Readings from Last 3 Encounters:  03/23/23 173 lb (78.5 kg)  05/19/22 165 lb 3.2 oz (74.9 kg)  02/12/22 166 lb 6.4 oz (75.5 kg)    General Appearance:    Alert, cooperative, no distress, appears stated age     Head:    Normocephalic, without obvious abnormality, atraumatic     Eyes:    PERRL, conjunctiva/corneas clear, EOM's intact, fundi benign   Ears:    Normal TM's and external ear canals     Nose:    No drainage or sinus tenderness     Throat:    Normal mucosa, no lesions  Neck:    Supple, no lymphadenopathy; thyroid: no enlargement/  tenderness/nodules; no carotid bruit or JVD     Back:    Spine nontender, no curvature, ROM normal, no CVA tenderness   Lungs:    Clear to auscultation bilaterally without wheezes, rales or ronchi; respirations unlabored     Chest Wall:    No tenderness or deformity     Heart:    Regular rate and rhythm, S1 and S2 normal, no murmur, rub or gallop     Breast Exam:    Nipple piercing on the right. No nipple discharge, inversion, skin dimpling, breast masses or axillary lymphadenopathy  Abdomen:    Soft, non-tender, nondistended, normoactive bowel sounds,   no masses, no hepatosplenomegaly. Umbilical piercing  Genitalia:    Normal external genitalia without lesions. No abnormal discharge. No cervical motion tenderness, cervical lesions, uterine or adnexal tenderness or masses. Pap performed.  Rectal:     Not performed due to age<40 and no related complaints     Extremities:    No clubbing, cyanosis or edema     Pulses:    2+ and symmetric all extremities     Skin:    Skin color, texture, turgor normal, no rashes or lesions.  Facial skin is clear, no significant acne noted. Mild hypertrophic scar at c-section scar and scar below right breast.  Scar below L breast is completely flat. No keloid formation.  Lymph nodes:    Cervical, supraclavicular, axillary and inguinal nodes normal     Neurologic:    Normal strength, sensation and gait; reflexes 2+ and symmetric throughout                          Psych:   Normal mood, affect, hygiene and grooming  ***UPDATE--nipple piercing on R only?  Skin--hypertrophic c/s scar and below R  breast?  ASSESSMENT/PLAN:  Pap smear Flu, COVID  Discussed monthly self breast exams and yearly mammograms after the age of 47; at least 30 minutes of aerobic activity at least 5 days/week, weight-bearing exercise at least 2x/wk; proper sunscreen use reviewed; healthy diet, including goals of calcium and vitamin D intake and alcohol recommendations (less than or equal to 1 drink/day) reviewed; regular seatbelt use; changing batteries in smoke detectors. Immunization recommendations discussed--continue yearly flu shots COVID booster Colon cancer screening--age 55.  F/u 1 year, sooner prn.

## 2023-06-10 NOTE — Patient Instructions (Incomplete)
  HEALTH MAINTENANCE RECOMMENDATIONS:  It is recommended that you get at least 30 minutes of aerobic exercise at least 5 days/week (for weight loss, you may need as much as 60-90 minutes). This can be any activity that gets your heart rate up. This can be divided in 10-15 minute intervals if needed, but try and build up your endurance at least once a week.  Weight bearing exercise is also recommended twice weekly.  Eat a healthy diet with lots of vegetables, fruits and fiber.  "Colorful" foods have a lot of vitamins (ie green vegetables, tomatoes, red peppers, etc).  Limit sweet tea, regular sodas and alcoholic beverages, all of which has a lot of calories and sugar.  Up to 1 alcoholic drink daily may be beneficial for women (unless trying to lose weight, watch sugars).  Drink a lot of water.  Calcium recommendations are 1200-1500 mg daily (1500 mg for postmenopausal women or women without ovaries), and vitamin D 1000 IU daily.  This should be obtained from diet and/or supplements (vitamins), and calcium should not be taken all at once, but in divided doses.  Monthly self breast exams and yearly mammograms for women over the age of 60 is recommended.  Sunscreen of at least SPF 30 should be used on all sun-exposed parts of the skin when outside between the hours of 10 am and 4 pm (not just when at beach or pool, but even with exercise, golf, tennis, and yard work!)  Use a sunscreen that says "broad spectrum" so it covers both UVA and UVB rays, and make sure to reapply every 1-2 hours.  Remember to change the batteries in your smoke detectors when changing your clock times in the spring and fall. Carbon monoxide detectors are recommended for your home.  Use your seat belt every time you are in a car, and please drive safely and not be distracted with cell phones and texting while driving.   I'm sending in 10 mg capsules of the fluoxetine.  If it isn't working well for your anxiety, increase it to 2  capsules, and contact us for a change to the 20 mg capsules.  My $0.02 for acne is to stop the adapalen, continue the Retin-A, and using a topical antibiotic (vs oral).  I think it is too early to consider accutane when you've only had 1 outbreak of cystic acne in a year, during a stressful time. I trust the dermatologist to give you good advice, this is just my opinion.  We will send in another prescription for vitamin D if it is still very low. If it is only mildly low, the recommendation is to take OTC D3 1000-2000 IU daily.  I will give an official recommendation when I get your labs back. But the D3 is a much smaller pill, easier for you to be able to take daily, when you take the prozac.  Consider cutting back on the egg yolks (egg whites are still full of protein)  Increase protein intake, continue a high intake of fruits and vegetables. Stick with complex carbs (limit white foods and sweets). Increase your exercise to get a minimum of 150 minutes of aerobic exercise each week.  If you want further weight loss measures, you can look into bariatric/weight loss clinics in [place], vs [facility] has one in [place].

## 2023-06-11 ENCOUNTER — Other Ambulatory Visit (HOSPITAL_COMMUNITY)
Admission: RE | Admit: 2023-06-11 | Discharge: 2023-06-11 | Disposition: A | Discharge status: Home / Self Care | Payer: 59 | Source: Ambulatory Visit | Attending: Family Medicine | Admitting: Family Medicine

## 2023-06-11 ENCOUNTER — Encounter: Payer: Self-pay | Admitting: Family Medicine

## 2023-06-11 ENCOUNTER — Ambulatory Visit (INDEPENDENT_AMBULATORY_CARE_PROVIDER_SITE_OTHER): Payer: 59 | Admitting: Family Medicine

## 2023-06-11 VITALS — BP 120/80 | HR 72 | Ht 64.0 in | Wt 173.4 lb

## 2023-06-11 DIAGNOSIS — E782 Mixed hyperlipidemia: Secondary | ICD-10-CM

## 2023-06-11 DIAGNOSIS — Z113 Encounter for screening for infections with a predominantly sexual mode of transmission: Secondary | ICD-10-CM

## 2023-06-11 DIAGNOSIS — R635 Abnormal weight gain: Secondary | ICD-10-CM

## 2023-06-11 DIAGNOSIS — E559 Vitamin D deficiency, unspecified: Secondary | ICD-10-CM

## 2023-06-11 DIAGNOSIS — Z6829 Body mass index (BMI) 29.0-29.9, adult: Secondary | ICD-10-CM | POA: Diagnosis not present

## 2023-06-11 DIAGNOSIS — Z0000 Encounter for general adult medical examination without abnormal findings: Secondary | ICD-10-CM | POA: Insufficient documentation

## 2023-06-11 DIAGNOSIS — R0683 Snoring: Secondary | ICD-10-CM | POA: Diagnosis not present

## 2023-06-11 DIAGNOSIS — F411 Generalized anxiety disorder: Secondary | ICD-10-CM | POA: Diagnosis not present

## 2023-06-11 DIAGNOSIS — L700 Acne vulgaris: Secondary | ICD-10-CM | POA: Diagnosis not present

## 2023-06-11 DIAGNOSIS — Z23 Encounter for immunization: Secondary | ICD-10-CM

## 2023-06-11 DIAGNOSIS — R21 Rash and other nonspecific skin eruption: Secondary | ICD-10-CM | POA: Diagnosis not present

## 2023-06-11 LAB — POCT URINALYSIS DIP (PROADVANTAGE DEVICE)
Bilirubin, UA: NEGATIVE
Blood, UA: NEGATIVE
Glucose, UA: NEGATIVE mg/dL
Ketones, POC UA: NEGATIVE mg/dL
Nitrite, UA: NEGATIVE
Protein Ur, POC: NEGATIVE mg/dL
Specific Gravity, Urine: 1.01
Urobilinogen, Ur: 0.2
pH, UA: 7 (ref 5.0–8.0)

## 2023-06-11 MED ORDER — FLUOXETINE HCL 10 MG PO CAPS: 10.0000 mg | ORAL_CAPSULE | Freq: Every day | ORAL | 3 refills | Status: DC

## 2023-06-11 MED ORDER — TRETINOIN 0.025 % EX CREA
TOPICAL_CREAM | Freq: Every day | CUTANEOUS | 11 refills | Status: AC
Start: 2023-06-11 — End: ?

## 2023-06-12 DIAGNOSIS — L700 Acne vulgaris: Secondary | ICD-10-CM | POA: Diagnosis not present

## 2023-06-12 DIAGNOSIS — L905 Scar conditions and fibrosis of skin: Secondary | ICD-10-CM | POA: Diagnosis not present

## 2023-06-12 DIAGNOSIS — Z5181 Encounter for therapeutic drug level monitoring: Secondary | ICD-10-CM | POA: Diagnosis not present

## 2023-06-12 LAB — CBC WITH DIFFERENTIAL/PLATELET
Basophils Absolute: 0 10*3/uL (ref 0.0–0.2)
Basos: 0 %
EOS (ABSOLUTE): 0.1 10*3/uL (ref 0.0–0.4)
Eos: 2 %
Hematocrit: 36 % (ref 34.0–46.6)
Hemoglobin: 11.2 g/dL (ref 11.1–15.9)
Immature Grans (Abs): 0 10*3/uL (ref 0.0–0.1)
Immature Granulocytes: 0 %
Lymphocytes Absolute: 1.9 10*3/uL (ref 0.7–3.1)
Lymphs: 29 %
MCH: 25.7 pg — ABNORMAL LOW (ref 26.6–33.0)
MCHC: 31.1 g/dL — ABNORMAL LOW (ref 31.5–35.7)
MCV: 83 fL (ref 79–97)
Monocytes Absolute: 0.5 10*3/uL (ref 0.1–0.9)
Monocytes: 7 %
Neutrophils Absolute: 4 10*3/uL (ref 1.4–7.0)
Neutrophils: 62 %
Platelets: 342 10*3/uL (ref 150–450)
RBC: 4.36 x10E6/uL (ref 3.77–5.28)
RDW: 13.4 % (ref 11.7–15.4)
WBC: 6.4 10*3/uL (ref 3.4–10.8)

## 2023-06-12 LAB — COMPREHENSIVE METABOLIC PANEL
ALT: 12 [IU]/L (ref 0–32)
AST: 14 [IU]/L (ref 0–40)
Albumin: 4.1 g/dL (ref 4.0–5.0)
Alkaline Phosphatase: 91 [IU]/L (ref 44–121)
BUN/Creatinine Ratio: 7 — ABNORMAL LOW (ref 9–23)
BUN: 5 mg/dL — ABNORMAL LOW (ref 6–20)
Bilirubin Total: 0.3 mg/dL (ref 0.0–1.2)
CO2: 23 mmol/L (ref 20–29)
Calcium: 9.7 mg/dL (ref 8.7–10.2)
Chloride: 102 mmol/L (ref 96–106)
Creatinine, Ser: 0.68 mg/dL (ref 0.57–1.00)
Globulin, Total: 2.9 g/dL (ref 1.5–4.5)
Glucose: 87 mg/dL (ref 70–99)
Potassium: 4.3 mmol/L (ref 3.5–5.2)
Sodium: 139 mmol/L (ref 134–144)
Total Protein: 7 g/dL (ref 6.0–8.5)
eGFR: 120 mL/min/{1.73_m2} (ref >59)

## 2023-06-12 LAB — LIPID PANEL
Chol/HDL Ratio: 4.4 ratio (ref 0.0–4.4)
Cholesterol, Total: 266 mg/dL — ABNORMAL HIGH (ref 100–199)
HDL: 61 mg/dL (ref >39)
LDL Chol Calc (NIH): 179 mg/dL — ABNORMAL HIGH (ref 0–99)
Triglycerides: 143 mg/dL (ref 0–149)
VLDL Cholesterol Cal: 26 mg/dL (ref 5–40)

## 2023-06-12 LAB — VITAMIN D 25 HYDROXY (VIT D DEFICIENCY, FRACTURES): Vit D, 25-Hydroxy: 23.9 ng/mL — ABNORMAL LOW (ref 30.0–100.0)

## 2023-06-12 LAB — RPR: RPR Ser Ql: NONREACTIVE

## 2023-06-12 LAB — HIV ANTIBODY (ROUTINE TESTING W REFLEX): HIV Screen 4th Generation wRfx: NONREACTIVE

## 2023-06-12 LAB — TSH: TSH: 3.01 u[IU]/mL (ref 0.450–4.500)

## 2023-06-12 MED ORDER — VITAMIN D (ERGOCALCIFEROL) 1.25 MG (50000 UNIT) PO CAPS: 50000.0000 [IU] | ORAL_CAPSULE | ORAL | 0 refills | Status: DC

## 2023-06-12 NOTE — Addendum Note (Signed)
Addended by: Joselyn Arrow on: 06/12/2023 08:29 AM   Modules accepted: Orders

## 2023-06-15 LAB — CYTOLOGY - PAP
Chlamydia: NEGATIVE
Comment: NEGATIVE
Comment: NEGATIVE
Comment: NEGATIVE
Comment: NORMAL
Diagnosis: NEGATIVE
High risk HPV: NEGATIVE
Neisseria Gonorrhea: NEGATIVE
Trichomonas: NEGATIVE

## 2023-06-18 ENCOUNTER — Other Ambulatory Visit: Payer: Self-pay | Admitting: *Deleted

## 2023-06-18 DIAGNOSIS — E559 Vitamin D deficiency, unspecified: Secondary | ICD-10-CM

## 2023-06-18 DIAGNOSIS — E782 Mixed hyperlipidemia: Secondary | ICD-10-CM

## 2023-06-24 ENCOUNTER — Encounter: Payer: Self-pay | Admitting: Family Medicine

## 2023-06-24 DIAGNOSIS — F411 Generalized anxiety disorder: Secondary | ICD-10-CM

## 2023-06-24 MED ORDER — FLUOXETINE HCL 20 MG PO CAPS: 20.0000 mg | ORAL_CAPSULE | Freq: Every day | ORAL | 3 refills | Status: DC

## 2023-06-25 ENCOUNTER — Encounter: Payer: Self-pay | Admitting: *Deleted

## 2023-07-24 DIAGNOSIS — L910 Hypertrophic scar: Secondary | ICD-10-CM | POA: Diagnosis not present

## 2023-08-14 DIAGNOSIS — Z79899 Other long term (current) drug therapy: Secondary | ICD-10-CM | POA: Diagnosis not present

## 2023-09-06 ENCOUNTER — Other Ambulatory Visit: Payer: Self-pay | Admitting: Family Medicine

## 2023-10-22 ENCOUNTER — Encounter: Payer: Self-pay | Admitting: Family Medicine

## 2023-10-22 ENCOUNTER — Other Ambulatory Visit: Payer: Self-pay | Admitting: *Deleted

## 2023-10-22 DIAGNOSIS — F410 Panic disorder [episodic paroxysmal anxiety] without agoraphobia: Secondary | ICD-10-CM

## 2023-10-22 DIAGNOSIS — F411 Generalized anxiety disorder: Secondary | ICD-10-CM

## 2023-11-17 HISTORY — PX: SCAR REVISION: SHX5285

## 2023-12-14 ENCOUNTER — Other Ambulatory Visit: Payer: 59

## 2024-06-19 NOTE — Patient Instructions (Incomplete)

## 2024-06-19 NOTE — Progress Notes (Unsigned)
 No chief complaint on file.  Hannah Brown is a 33 y.o. female who presents for a complete physical and f/u on anxiety,  Anxiety--She has done well with fluoxetine --increasing it to 20 mg when stress got much worse (having more anxiety, insomnia and needing hydroxyzine more for sleep).  She is currently taking 20 mg ***  She previously noted libido issues and trouble achieving orgasm on the 20 mg dose, but had no issues when discussed last year, on 20mg  dose (wasn't in a relationship).  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 She is doing well with single parenting (3 yo daughter)    Allergies:  Allergies are controlled with use of Flonase and claritin. She still snores (though it is less since using flonase).  She doesn't have anybody living with her to tell her if she still snores or has apnea, but thinks she snores. Denies unrefreshed sleep or daytime somnolence.   Cystic/nodular acne.  Under the care of [facility] derm.  She previously took Accutane (tolerated it only x 2 mos, in 2021, stopped d/t muscle pain), also previously took Spironolactone, Epiduo .  At her physical last year, she was using Retin-A  daily, along with OTC differin . She had just recently noted cystic outbreak. She ended up doing a 3 month course of Accutane through [facility] dermatology, starting 06/2023, through the end of 09/2023.   She has been seeing plastic surgery at [facility] for hypertrophic c-section scar.  She underwent scar revision 11/2023. She had steroid injection on 03/31/2024, and has been utilizing silicone for her scar. Last seen a month ago.  Mild hyperlipidemia:  LDL was up to 179 last year, up from 152 the year prior, contributed by increased egg intake (was eating 12 eggs/week last check). Lipids had been better when she was vegan. She was supposed to return for recheck in April, but never came for labs. She had TG checked in 08/2023 when on Accutane, TG  163.  Today she reports  vegetarian and doesn't eat dairy, but eats eggs. 12 yolks/week?? *** Doesn't eat much fried foods, some sweets (likes to bake, but bakes vegan--no butter/eggs/dairy). Due for recheck.  Component Ref Range & Units (hover) 1 yr ago 2 yr ago 3 yr ago 4 yr ago 5 yr ago 8 yr ago 12 yr ago  Cholesterol, Total 266 High  236 High  201 High  213 High  228 High     Triglycerides 143 123 176 High  85 210 High  129 R 111 R  HDL 61 62 58 57 59 71 R 53 R  VLDL Cholesterol Cal 26 22 31 15  37    LDL Chol Calc (NIH) 179 High  152 High  112 High  141 High  132 High     Chol/HDL Ratio 4.4 3.8 CM 3.5 CM 3.7 CM 3.9 CM 2.9 R 3.7 R    Vitamin D  deficiency:  level was low last year when taking a MVI once a week, and no separate D3. Previously had been lower, at 10.8 in 05/2022. Last year she was treated with another course of prescription vitamin D , and was encouraged to take 1000-2000 IU daily of D3. (This was supposed to be rechecked in April, but didn't get labs done). She is currently taking ***  Component Ref Range & Units (hover) 1 yr ago 2 yr ago 9 yr ago  Vit D, 25-Hydroxy 23.9 Low  10.8 Low  CM 32 R, CM  Immunization History  Administered Date(s) Administered   DTaP 07/31/1993, 11/06/1993, 01/24/1994, 11/06/1994, 02/23/1998   HPV Quadrivalent 01/07/2012, 03/01/2012, 01/17/2013   Hepatitis A 10/10/2010, 01/07/2012   Hepatitis B 01-27-1993, 07/31/1993, 05/02/1994   IPV 07/31/1993, 11/06/1993, 05/02/1994, 02/23/1998   Influenza Split 06/27/2013, 06/10/2014   Influenza, Seasonal, Injecte, Preservative Fre 06/11/2023   Influenza,inj,Quad PF,6+ Mos 04/20/2019, 04/30/2020, 05/06/2021, 05/19/2022   Influenza-Unspecified 06/06/2016, 06/11/2017, 06/10/2018   MMR 11/06/1994, 02/23/1998   Meningococcal Conjugate 10/10/2010   Moderna Covid-19 Vaccine Bivalent Booster 36yrs & up 07/22/2021   PFIZER Comirnaty(Gray Top)Covid-19 Tri-Sucrose Vaccine 09/02/2019, 09/23/2019    PFIZER(Purple Top)SARS-COV-2 Vaccination 09/02/2019, 09/23/2019, 05/01/2020   Pfizer(Comirnaty)Fall Seasonal Vaccine 12 years and older 06/11/2023   Td 09/20/2010   Tdap 10/11/2011, 11/19/2020   Last Pap smear: 05/2023 NILM, neg HR HPV, negative STD check. Contraception: *** Last mammogram: never   Last colonoscopy: never   Last DEXA: never   Dentist: twice yearly   Ophtho:yearly Exercise:   Pilates 3-5 days/week, 50 minutes (does the cardio one just once a week). Walks 3-5 miles when she doesn't go to pilates, once or twice a week. +lifting baby (25#)    PMH, PSH, SH and FH reviewed and updated     ROS: The patient denies anorexia, fever, headaches, vision changes, weight changes, decreased hearing, ear pain, sore throat, breast concerns, chest pain, palpitations, dizziness, syncope, dyspnea on exertion, cough, swelling, nausea, vomiting, diarrhea, constipation, abdominal pain, melena, hematochezia, indigestion/heartburn, hematuria, incontinence, dysuria, irregular menstrual cycles, genital lesions, vaginal discharge, joint pains, numbness, tingling, weakness, tremor, suspicious skin lesions, abnormal bleeding/bruising, or enlarged lymph nodes.  Seasonal allergies--controlled with flonase, claritin Acne is significantly improved (after course of accutane) Flaking on feet, has d/w derm. Anxiety *** No sexual side effects from SSRI     PHYSICAL EXAM:  There were no vitals taken for this visit.  Wt Readings from Last 3 Encounters:  06/11/23 173 lb 6.4 oz (78.7 kg)  03/23/23 173 lb (78.5 kg)  05/19/22 165 lb 3.2 oz (74.9 kg)    General Appearance:    Alert, cooperative, no distress, appears stated age     Head:    Normocephalic, without obvious abnormality, atraumatic     Eyes:    PERRL, conjunctiva/corneas clear, EOM's intact, fundi benign   Ears:    Normal TM's and external ear canals     Nose:    No drainage or sinus tenderness     Throat:    Normal mucosa, no  lesions  Neck:    Supple, no lymphadenopathy; thyroid: no enlargement/  tenderness/nodules; no carotid bruit or JVD     Back:    Spine nontender, no curvature, ROM normal, no CVA tenderness   Lungs:    Clear to auscultation bilaterally without wheezes, rales or ronchi; respirations unlabored     Chest Wall:    No tenderness or deformity     Heart:    Regular rate and rhythm, S1 and S2 normal, no murmur, rub or gallop     Breast Exam:    Nipple piercing on the right. No nipple discharge, inversion, skin dimpling, breast masses or axillary lymphadenopathy  Abdomen:    Soft, non-tender, nondistended, normoactive bowel sounds,   no masses, no hepatosplenomegaly. Umbilical piercing  Genitalia:    Normal external genitalia without lesions. No abnormal discharge. No cervical motion tenderness, cervical lesions, uterine or adnexal tenderness or masses. Pap performed.  Rectal:    Not performed due to age<40 and no related complaints  Extremities:    No clubbing, cyanosis or edema     Pulses:    2+ and symmetric all extremities     Skin:    Skin color, texture, turgor normal, no rashes or lesions.  WHSS below both breasts, and c-section scar.    Lymph nodes:    Cervical, supraclavicular, axillary and inguinal nodes normal     Neurologic:    Normal strength, sensation and gait; reflexes 2+ and symmetric throughout                          Psych:   Normal mood, affect, hygiene and grooming  ***UPDATE--nipple piercing on ly on R??, umb piercing. Update SCAR on lower abd     06/11/2023    9:28 AM 05/19/2022    9:44 AM 05/06/2021    9:49 AM 04/30/2020    9:36 AM 04/20/2019    8:31 AM  Depression screen PHQ 2/9  Decreased Interest 0 0 0 2 0  Down, Depressed, Hopeless 0 0 0 1 0  PHQ - 2 Score 0 0 0 3 0  Altered sleeping 0   2   Tired, decreased energy 0   1   Change in appetite 0   3   Feeling bad or failure about yourself  0   0   Trouble concentrating 0   3   Moving slowly or fidgety/restless 0    0   Suicidal thoughts 0   0   PHQ-9 Score 0   12   Difficult doing work/chores Not difficult at all         ASSESSMENT/PLAN:  Flu and covid boosters  Consider adding fasting glu vs chem panel, depending on wt, meds   Last year we Discussed snoring vs OSA--?sleep study? ENT eval?  Last year discussed  [facility] in [place], vs [facility] in [place] where she lives for weight loss   Discussed monthly self breast exams and yearly mammograms after the age of 90; at least 30 minutes of aerobic activity at least 5 days/week, weight-bearing exercise at least 2x/wk; proper sunscreen use reviewed; healthy diet, including goals of calcium and vitamin D  intake and alcohol recommendations (less than or equal to 1 drink/day) reviewed; regular seatbelt use; changing batteries in smoke detectors. Immunization recommendations discussed--continue yearly flu shots, given today. COVID booster given today. Colon cancer screening--age 5.  F/u 1 year, sooner prn.

## 2024-06-20 ENCOUNTER — Encounter: Payer: Self-pay | Admitting: Family Medicine

## 2024-06-20 ENCOUNTER — Ambulatory Visit: Payer: PRIVATE HEALTH INSURANCE | Admitting: Family Medicine

## 2024-06-20 VITALS — BP 128/78 | HR 72 | Ht 65.0 in | Wt 168.0 lb

## 2024-06-20 DIAGNOSIS — L700 Acne vulgaris: Secondary | ICD-10-CM

## 2024-06-20 DIAGNOSIS — Z0000 Encounter for general adult medical examination without abnormal findings: Secondary | ICD-10-CM

## 2024-06-20 DIAGNOSIS — F411 Generalized anxiety disorder: Secondary | ICD-10-CM | POA: Diagnosis not present

## 2024-06-20 DIAGNOSIS — Z113 Encounter for screening for infections with a predominantly sexual mode of transmission: Secondary | ICD-10-CM | POA: Diagnosis not present

## 2024-06-20 DIAGNOSIS — E782 Mixed hyperlipidemia: Secondary | ICD-10-CM

## 2024-06-20 DIAGNOSIS — F410 Panic disorder [episodic paroxysmal anxiety] without agoraphobia: Secondary | ICD-10-CM | POA: Diagnosis not present

## 2024-06-20 DIAGNOSIS — E559 Vitamin D deficiency, unspecified: Secondary | ICD-10-CM

## 2024-06-20 DIAGNOSIS — Z23 Encounter for immunization: Secondary | ICD-10-CM

## 2024-06-20 LAB — CBC WITH DIFFERENTIAL/PLATELET

## 2024-06-20 MED ORDER — FLUOXETINE HCL 20 MG PO CAPS: 20.0000 mg | ORAL_CAPSULE | Freq: Every day | ORAL | 3 refills | Status: AC

## 2024-06-20 MED ORDER — HYDROXYZINE PAMOATE 25 MG PO CAPS: 25.0000 mg | ORAL_CAPSULE | ORAL | 0 refills | Status: AC | PRN

## 2024-06-20 MED ORDER — BUSPIRONE HCL 15 MG PO TABS: ORAL_TABLET | ORAL | 1 refills | Status: DC

## 2024-06-21 ENCOUNTER — Other Ambulatory Visit: Payer: Self-pay | Admitting: *Deleted

## 2024-06-21 ENCOUNTER — Ambulatory Visit: Payer: Self-pay | Admitting: Family Medicine

## 2024-06-21 DIAGNOSIS — E7800 Pure hypercholesterolemia, unspecified: Secondary | ICD-10-CM

## 2024-06-21 LAB — CBC WITH DIFFERENTIAL/PLATELET
Basos: 1 %
EOS (ABSOLUTE): 0 x10E3/uL (ref 0.0–0.2)
Eos: 1 %
Hematocrit: 37.2 % (ref 34.0–46.6)
Hemoglobin: 12.1 g/dL (ref 11.1–15.9)
Immature Granulocytes: 0 %
Immature Granulocytes: 0 x10E3/uL (ref 0.0–0.1)
Lymphs: 31 %
MCH: 27 pg (ref 26.6–33.0)
MCHC: 32.5 g/dL (ref 31.5–35.7)
MCV: 83 fL (ref 79–97)
Monocytes Absolute: 0.1 x10E3/uL (ref 0.0–0.4)
Monocytes Absolute: 0.5 x10E3/uL (ref 0.1–0.9)
Monocytes: 9 %
Neutrophils Absolute: 1.7 x10E3/uL (ref 0.7–3.1)
Neutrophils Absolute: 3.2 x10E3/uL (ref 1.4–7.0)
Neutrophils: 58 %
Platelets: 356 x10E3/uL (ref 150–450)
RBC: 4.48 x10E6/uL (ref 3.77–5.28)
RDW: 12.6 % (ref 11.7–15.4)
WBC: 5.5 x10E3/uL (ref 3.4–10.8)

## 2024-06-21 LAB — LIPID PANEL
Chol/HDL Ratio: 4.5 ratio — ABNORMAL HIGH (ref 0.0–4.4)
Cholesterol, Total: 273 mg/dL — ABNORMAL HIGH (ref 100–199)
HDL: 61 mg/dL (ref >39)
LDL Chol Calc (NIH): 188 mg/dL — ABNORMAL HIGH (ref 0–99)
Triglycerides: 136 mg/dL (ref 0–149)
VLDL Cholesterol Cal: 24 mg/dL (ref 5–40)

## 2024-06-21 LAB — RPR: RPR Ser Ql: NONREACTIVE

## 2024-06-21 LAB — HIV ANTIBODY (ROUTINE TESTING W REFLEX): HIV Screen 4th Generation wRfx: NONREACTIVE

## 2024-06-21 LAB — VITAMIN D 25 HYDROXY (VIT D DEFICIENCY, FRACTURES): Vit D, 25-Hydroxy: 38.1 ng/mL (ref 30.0–100.0)

## 2024-06-22 LAB — GC/CHLAMYDIA PROBE AMP
Chlamydia trachomatis, NAA: NEGATIVE
Neisseria Gonorrhoeae by PCR: NEGATIVE

## 2024-07-12 ENCOUNTER — Other Ambulatory Visit: Payer: Self-pay | Admitting: Family Medicine

## 2024-07-12 DIAGNOSIS — F411 Generalized anxiety disorder: Secondary | ICD-10-CM

## 2024-08-16 ENCOUNTER — Other Ambulatory Visit: Payer: Self-pay | Admitting: Family Medicine

## 2024-08-16 DIAGNOSIS — F411 Generalized anxiety disorder: Secondary | ICD-10-CM

## 2024-08-17 NOTE — Telephone Encounter (Signed)
 Left message for pt to call back to verify dose and how often used

## 2024-08-19 MED ORDER — BUSPIRONE HCL 10 MG PO TABS: 10.0000 mg | ORAL_TABLET | Freq: Two times a day (BID) | ORAL | 1 refills | Status: AC

## 2024-08-19 NOTE — Telephone Encounter (Signed)
 Patient returned call to advised she has been taking Buspar  10 MG 2 times a day.   Can be reached at [phone] for additional questions.

## 2024-09-18 ENCOUNTER — Encounter: Payer: Self-pay | Admitting: *Deleted

## 2024-09-19 ENCOUNTER — Other Ambulatory Visit
